# Patient Record
Sex: Female | Born: 1995
Health system: Southern US, Community
[De-identification: ages and names within clinical notes are randomized; demographics above are authoritative.]

## PROBLEM LIST (undated history)

## (undated) DIAGNOSIS — N926 Irregular menstruation, unspecified: Principal | ICD-10-CM

## (undated) DIAGNOSIS — J302 Other seasonal allergic rhinitis: Secondary | ICD-10-CM

## (undated) DIAGNOSIS — E281 Androgen excess: Secondary | ICD-10-CM

## (undated) DIAGNOSIS — Z7689 Persons encountering health services in other specified circumstances: Principal | ICD-10-CM

## (undated) DIAGNOSIS — Z309 Encounter for contraceptive management, unspecified: Secondary | ICD-10-CM

## (undated) HISTORY — DX: Persons encountering health services in other specified circumstances: Z76.89

## (undated) HISTORY — DX: Irregular menstruation, unspecified: N92.6

## (undated) HISTORY — PX: GASTROSCHISIS CLOSURE: SHX1700

## (undated) HISTORY — DX: Other seasonal allergic rhinitis: J30.2

## (undated) HISTORY — DX: Androgen excess: E28.1

## (undated) HISTORY — DX: Encounter for contraceptive management, unspecified: Z30.9

---
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
## 2014-06-27 ENCOUNTER — Encounter: Payer: Self-pay | Admitting: Adult Health

## 2014-06-27 ENCOUNTER — Ambulatory Visit (INDEPENDENT_AMBULATORY_CARE_PROVIDER_SITE_OTHER): Payer: BC Managed Care – PPO | Admitting: Adult Health

## 2014-06-27 VITALS — BP 122/70 | HR 84 | Ht 61.0 in | Wt 197.0 lb

## 2014-06-27 DIAGNOSIS — Z3041 Encounter for surveillance of contraceptive pills: Secondary | ICD-10-CM | POA: Diagnosis not present

## 2014-06-27 DIAGNOSIS — Z309 Encounter for contraceptive management, unspecified: Secondary | ICD-10-CM | POA: Insufficient documentation

## 2014-06-27 HISTORY — DX: Encounter for contraceptive management, unspecified: Z30.9

## 2014-06-27 MED ORDER — NORGESTIM-ETH ESTRAD TRIPHASIC 0.18/0.215/0.25 MG-35 MCG PO TABS: 1.0000 | ORAL_TABLET | Freq: Every day | ORAL | Status: DC

## 2014-06-27 NOTE — Progress Notes (Signed)
Subjective:     Patient ID: Morgan Franklin Franklin, female   DOB: 1995/09/14, 19 y.o.   MRN: [id number]  HPI Morgan Franklin is a [age] year old white female in to get refill on OCs.  Review of Systems Patient denies any headaches, hearing loss, fatigue, blurred vision, shortness of breath, chest pain, abdominal pain, problems with bowel movements, urination, or intercourse. No joint pain or mood swings.Periods are regular on the pill.  Reviewed past medical,surgical, social and family history. Reviewed medications and allergies.     Objective:   Physical Exam BP 122/70 mmHg  Pulse 84  Ht 5\' 1"  (1.549 m)  Wt 197 lb (89.359 kg)  BMI 37.24 kg/m2  LMP 06/24/2014 Skin warm and dry. Neck: mid line trachea, normal thyroid, good ROM, no lymphadenopathy noted. Lungs: clear to ausculation bilaterally. Cardiovascular: regular rate and rhythm.She is happy with the pill and wants to continue.    Assessment:     Contraceptive management    Plan:    Use condoms Refilled tri sprintec x 1 year. Take 1 daily disp 1 pack Return in 1 year for pap and physical

## 2014-06-27 NOTE — Patient Instructions (Signed)
Pap at 20 Continue OCs Use condoms

## 2015-05-25 ENCOUNTER — Encounter: Payer: Self-pay | Admitting: Adult Health

## 2015-05-25 ENCOUNTER — Ambulatory Visit (INDEPENDENT_AMBULATORY_CARE_PROVIDER_SITE_OTHER): Payer: BC Managed Care – PPO | Admitting: Adult Health

## 2015-05-25 VITALS — BP 104/70 | HR 80 | Ht 61.0 in | Wt 211.0 lb

## 2015-05-25 DIAGNOSIS — Z3041 Encounter for surveillance of contraceptive pills: Secondary | ICD-10-CM | POA: Diagnosis not present

## 2015-05-25 MED ORDER — NORGESTIM-ETH ESTRAD TRIPHASIC 0.18/0.215/0.25 MG-35 MCG PO TABS: 1.0000 | ORAL_TABLET | Freq: Every day | ORAL | Status: DC

## 2015-05-25 NOTE — Progress Notes (Signed)
Subjective:     Patient ID: Morgan Franklin, female   DOB: 09/01/1995, 20 y.o.   MRN: [id number]  HPI Morgan Franklin is a 20 year old white female in to get refill on OCs, she is doing well and just had 20st birthday yesterday.   Review of Systems Patient denies any headaches, hearing loss, fatigue, blurred vision, shortness of breath, chest pain, abdominal pain, problems with bowel movements, urination, or intercourse. No joint pain or mood swings. Reviewed past medical,surgical, social and family history. Reviewed medications and allergies.     Objective:   Physical Exam BP 104/70 mmHg  Pulse 80  Ht  (1.549 m)  Wt 211 lb (95.709 kg)  BMI 39.89 kg/m2  LMP 04/24/2015 (Approximate) Skin warm and dry. Neck: mid line trachea, normal thyroid, good ROM, no lymphadenopathy noted. Lungs: clear to ausculation bilaterally. Cardiovascular: regular rate and rhythm.She is happy with her pills and wants to continue.    Assessment:     Contraceptive management    Plan:     Refilled norgestimate-ethinyl estradiol triphasic 0.18/0.215/0.25 mg-35 mCG tablets,  Disp 1 pack take 1 daily with 11 refills   Return in June for pap and physical

## 2015-05-25 NOTE — Patient Instructions (Signed)
Continue OC s Return in June for pap and physical

## 2015-09-19 ENCOUNTER — Encounter (HOSPITAL_COMMUNITY): Payer: Self-pay | Admitting: Emergency Medicine

## 2015-09-19 ENCOUNTER — Emergency Department (HOSPITAL_COMMUNITY)
Admission: EM | Admit: 2015-09-19 | Discharge: 2015-09-19 | Disposition: A | Discharge status: Home / Self Care | Payer: Worker's Compensation | Attending: Emergency Medicine | Admitting: Emergency Medicine

## 2015-09-19 DIAGNOSIS — Y929 Unspecified place or not applicable: Secondary | ICD-10-CM | POA: Insufficient documentation

## 2015-09-19 DIAGNOSIS — W208XXA Other cause of strike by thrown, projected or falling object, initial encounter: Secondary | ICD-10-CM | POA: Insufficient documentation

## 2015-09-19 DIAGNOSIS — S0990XA Unspecified injury of head, initial encounter: Secondary | ICD-10-CM

## 2015-09-19 DIAGNOSIS — Y999 Unspecified external cause status: Secondary | ICD-10-CM | POA: Insufficient documentation

## 2015-09-19 DIAGNOSIS — S098XXA Other specified injuries of head, initial encounter: Secondary | ICD-10-CM | POA: Diagnosis not present

## 2015-09-19 DIAGNOSIS — Y939 Activity, unspecified: Secondary | ICD-10-CM | POA: Diagnosis not present

## 2015-09-19 MED ORDER — IBUPROFEN 800 MG PO TABS
800.0000 mg | ORAL_TABLET | Freq: Once | ORAL | Status: AC
  Administered 2015-09-19: 800 mg via ORAL
  Filled 2015-09-19: qty 1

## 2015-09-19 NOTE — Discharge Instructions (Signed)
Head Injury, Adult °You have a head injury. Headaches and throwing up (vomiting) are common after a head injury. It should be easy to wake up from sleeping. Sometimes you must stay in the hospital. Most problems happen within the first 24 hours. Side effects may occur up to 7-10 days after the injury.  °WHAT ARE THE TYPES OF HEAD INJURIES? °Head injuries can be as minor as a bump. Some head injuries can be more severe. More severe head injuries include: °· A jarring injury to the brain (concussion). °· A bruise of the brain (contusion). This mean there is bleeding in the brain that can cause swelling. °· A cracked skull (skull fracture). °· Bleeding in the brain that collects, clots, and forms a bump (hematoma). °WHEN SHOULD I GET HELP RIGHT AWAY?  °· You are confused or sleepy. °· You cannot be woken up. °· You feel sick to your stomach (nauseous) or keep throwing up (vomiting). °· Your dizziness or unsteadiness is getting worse. °· You have very bad, lasting headaches that are not helped by medicine. Take medicines only as told by your doctor. °· You cannot use your arms or legs like normal. °· You cannot walk. °· You notice changes in the black spots in the center of the colored part of your eye (pupil). °· You have clear or bloody fluid coming from your nose or ears. °· You have trouble seeing. °During the next 24 hours after the injury, you must stay with someone who can watch you. This person should get help right away (call 911 in the [place] if you start to shake and are not able to control it (have seizures), you pass out, or you are unable to wake up. °HOW CAN I PREVENT A HEAD INJURY IN THE FUTURE? °· Wear seat belts. °· Wear a helmet while bike riding and playing sports like football. °· Stay away from dangerous activities around the house. °WHEN CAN I RETURN TO NORMAL ACTIVITIES AND ATHLETICS? °See your doctor before doing these activities. You should not do normal activities or play contact sports until 1  week after the following symptoms have stopped: °· Headache that does not go away. °· Dizziness. °· Poor attention. °· Confusion. °· Memory problems. °· Sickness to your stomach or throwing up. °· Tiredness. °· Fussiness. °· Bothered by bright lights or loud noises. °· Anxiousness or depression. °· Restless sleep. °MAKE SURE YOU:  °· Understand these instructions. °· Will watch your condition. °· Will get help right away if you are not doing well or get worse. °  °This information is not intended to replace advice given to you by your health care provider. Make sure you discuss any questions you have with your health care provider. °  °Document Released: 03/06/2008 Document Revised: 04/14/2014 Document Reviewed: 11/29/2012 °Elsevier Interactive Patient Education ©2016 [employer] ° °

## 2015-09-19 NOTE — ED Notes (Signed)
Pt c/o head pain after a quart container of paint struck her head. Pt denies any loc.
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
## 2016-01-01 DIAGNOSIS — M79673 Pain in unspecified foot: Secondary | ICD-10-CM | POA: Diagnosis not present

## 2016-01-25 DIAGNOSIS — M79673 Pain in unspecified foot: Secondary | ICD-10-CM | POA: Diagnosis not present

## 2016-01-25 DIAGNOSIS — Z6839 Body mass index (BMI) 39.0-39.9, adult: Secondary | ICD-10-CM | POA: Diagnosis not present

## 2016-02-19 DIAGNOSIS — M7730 Calcaneal spur, unspecified foot: Secondary | ICD-10-CM | POA: Diagnosis not present

## 2016-03-18 DIAGNOSIS — M79671 Pain in right foot: Secondary | ICD-10-CM | POA: Diagnosis not present

## 2016-03-18 DIAGNOSIS — M722 Plantar fascial fibromatosis: Secondary | ICD-10-CM | POA: Diagnosis not present

## 2016-05-07 ENCOUNTER — Telehealth: Payer: Self-pay | Admitting: Adult Health

## 2016-05-07 DIAGNOSIS — M25531 Pain in right wrist: Secondary | ICD-10-CM | POA: Diagnosis not present

## 2016-05-07 MED ORDER — NORGESTIM-ETH ESTRAD TRIPHASIC 0.18/0.215/0.25 MG-35 MCG PO TABS: 1.0000 | ORAL_TABLET | Freq: Every day | ORAL | 3 refills | Status: DC

## 2016-05-07 NOTE — Telephone Encounter (Signed)
Pt called stating that she would like a refill of her Birth control medication. Pt states that she uses [facility] in [place]. Please contact pt

## 2016-05-07 NOTE — Telephone Encounter (Signed)
Will refill OCs x 3 

## 2016-05-07 NOTE — Telephone Encounter (Signed)
Spoke with pt. Pt is requesting a refill on birth control. I advised pt needs to schedule an appt for pap and physical. Pt will check schedule and call back for appt. Thanks!! JSY

## 2016-07-10 ENCOUNTER — Encounter: Payer: Self-pay | Admitting: Adult Health

## 2016-07-10 ENCOUNTER — Ambulatory Visit (INDEPENDENT_AMBULATORY_CARE_PROVIDER_SITE_OTHER): Payer: BLUE CROSS/BLUE SHIELD | Admitting: Adult Health

## 2016-07-10 VITALS — BP 102/60 | HR 77 | Ht 60.0 in | Wt 216.5 lb

## 2016-07-10 DIAGNOSIS — R109 Unspecified abdominal pain: Secondary | ICD-10-CM | POA: Diagnosis not present

## 2016-07-10 DIAGNOSIS — Z3041 Encounter for surveillance of contraceptive pills: Secondary | ICD-10-CM | POA: Diagnosis not present

## 2016-07-10 MED ORDER — NORGESTIM-ETH ESTRAD TRIPHASIC 0.18/0.215/0.25 MG-35 MCG PO TABS: 1.0000 | ORAL_TABLET | Freq: Every day | ORAL | 4 refills | Status: DC

## 2016-07-10 MED ORDER — NORETHIN ACE-ETH ESTRAD-FE 1-20 MG-MCG(24) PO CAPS: 1.0000 | ORAL_CAPSULE | Freq: Every day | ORAL | 0 refills | Status: DC

## 2016-07-10 NOTE — Progress Notes (Signed)
Subjective:     Patient ID: Morgan Franklin, female   DOB: September 07, 1995, 21 y.o.   MRN: [id number]  HPI Morgan Franklin is a 21 year old white female in for refill on OCs, has more cramping recently.   Review of Systems Patient denies any headaches, hearing loss, fatigue, blurred vision, shortness of breath, chest pain,  problems with bowel movements, urination, or intercourse. No joint pain or mood swings.+cramps  Reviewed past medical,surgical, social and family history. Reviewed medications and allergies.     Objective:   Physical Exam BP 102/60 (BP Location: Left Arm, Patient Position: Sitting, Cuff Size: Large)   Pulse 77   Ht 5' (1.524 m)   Wt 216 lb 8 oz (98.2 kg)   LMP 06/22/2016 (Approximate)   BMI 42.28 kg/m  Skin warm and dry. Lungs: clear to ausculation bilaterally. Cardiovascular: regular rate and rhythm.   Will change OCs and will get back in for pap and physical.  Assessment:     1. Encounter for surveillance of contraceptive pills   2. Abdominal cramping       Plan:     Meds ordered this encounter  Medications  . DISCONTD: Norgestimate-Ethinyl Estradiol Triphasic 0.18/0.215/0.25 MG-35 MCG tablet    Sig: Take 1 tablet by mouth daily.    Dispense:  3 Package    Refill:  4    Order Specific Question:   Supervising Provider    Answer:   Despina Hidden, LUTHER H [[id number]]  . Norethin Ace-Eth Estrad-FE (TAYTULLA) 1-20 MG-MCG(24) CAPS    Sig: Take 1 tablet by mouth daily.    Dispense:  112 capsule    Refill:  0    Order Specific Question:   Supervising Provider    Answer:   Duane Lope H [[id number]]  Follow up in 3 months for pap and physical

## 2016-10-22 ENCOUNTER — Other Ambulatory Visit (HOSPITAL_COMMUNITY)
Admission: RE | Admit: 2016-10-22 | Discharge: 2016-10-22 | Disposition: A | Discharge status: Home / Self Care | Payer: BLUE CROSS/BLUE SHIELD | Source: Ambulatory Visit | Attending: Adult Health | Admitting: Adult Health

## 2016-10-22 ENCOUNTER — Encounter: Payer: Self-pay | Admitting: Adult Health

## 2016-10-22 ENCOUNTER — Ambulatory Visit (INDEPENDENT_AMBULATORY_CARE_PROVIDER_SITE_OTHER): Payer: BLUE CROSS/BLUE SHIELD | Admitting: Adult Health

## 2016-10-22 VITALS — BP 110/80 | HR 76 | Ht 61.0 in | Wt 198.0 lb

## 2016-10-22 DIAGNOSIS — Z3041 Encounter for surveillance of contraceptive pills: Secondary | ICD-10-CM | POA: Diagnosis not present

## 2016-10-22 DIAGNOSIS — Z01419 Encounter for gynecological examination (general) (routine) without abnormal findings: Secondary | ICD-10-CM | POA: Diagnosis not present

## 2016-10-22 MED ORDER — NORETHIN ACE-ETH ESTRAD-FE 1-20 MG-MCG(24) PO CAPS: 1.0000 | ORAL_CAPSULE | Freq: Every day | ORAL | 4 refills | Status: DC

## 2016-10-22 NOTE — Progress Notes (Signed)
Patient ID: Morgan GoodellKatie Franklin, female   DOB: [birth year], 21 y.o.   MRN: [id number] History of Present Illness: Morgan Franklin is a 21 year old white female in for a well woman gyn exam and first pap.Has been on Taytulla for almost 3 months and cramps better.  [facility]P is [facility].    Current Medications, Allergies, Past Medical History, Past Surgical History, Family History and Social History were reviewed in [facility] electronic medical record.     Review of Systems: Patient denies any headaches, hearing loss, fatigue, blurred vision, shortness of breath, chest pain, abdominal pain, problems with bowel movements, urination, or intercourse. No joint pain or mood swings.Cramps better on OCs    Physical Exam:BP 110/80 (BP Location: Left Arm, Patient Position: Sitting, Cuff Size: Small)   Pulse 76   Ht 5\' 1"  (1.549 m)   Wt 198 lb (89.8 kg)   LMP 10/12/2016   BMI 37.41 kg/m  General:  Well developed, well nourished, no acute distress Skin:  Warm and dry Neck:  Midline trachea, normal thyroid, good ROM, no lymphadenopathy Lungs; Clear to auscultation bilaterally Breast:  No dominant palpable mass, retraction, or nipple discharge Cardiovascular: Regular rate and rhythm Abdomen:  Soft, non tender, no hepatosplenomegaly,increased hair growth Pelvic:  External genitalia is normal in appearance, no lesions.  The vagina is normal in appearance. Urethra has no lesions or masses. The cervix is smooth, pap with GC/CHL and reflex HPV performed.  Uterus is felt to be normal size, shape, and contour.  No adnexal masses or tenderness noted.Bladder is non tender, no masses felt. Extremities/musculoskeletal:  No swelling or varicosities noted, no clubbing or cyanosis Psych:  No mood changes, alert and cooperative,seems happy PHQ 2 score 0.   Impression: 1. Encounter for gynecological examination with Papanicolaou smear of cervix   2. Encounter for surveillance of contraceptive pills       Plan: Meds ordered  this encounter  Medications  . Norethin Ace-Eth Estrad-FE (TAYTULLA) 1-20 MG-MCG(24) CAPS    Sig: Take 1 capsule by mouth daily.    Dispense:  84 capsule    Refill:  4    Order Specific Question:   Supervising Provider    Answer:   Lazaro ArmsEURE, Morgan H [[id number]]  Physical in 1 year Pap in 3 if normal Pap with GC/CHLand reflex HPV sent Labs with PCP

## 2016-10-22 NOTE — Addendum Note (Signed)
Addended by: Federico FlakeNES, Cleburn Maiolo A on: 10/22/2016 10:05 AM   Modules accepted: Orders

## 2016-10-23 LAB — CYTOLOGY - PAP
CHLAMYDIA, DNA PROBE: NEGATIVE
DIAGNOSIS: NEGATIVE
HPV (WINDOPATH): NOT DETECTED
NEISSERIA GONORRHEA: NEGATIVE

## 2017-04-10 DIAGNOSIS — S21109A Unspecified open wound of unspecified front wall of thorax without penetration into thoracic cavity, initial encounter: Secondary | ICD-10-CM | POA: Diagnosis not present

## 2017-04-28 ENCOUNTER — Ambulatory Visit: Payer: BLUE CROSS/BLUE SHIELD | Admitting: General Surgery

## 2017-04-28 ENCOUNTER — Encounter: Payer: Self-pay | Admitting: General Surgery

## 2017-04-28 VITALS — BP 157/86 | HR 84 | Temp 99.8°F | Ht 60.0 in | Wt 213.0 lb

## 2017-04-28 DIAGNOSIS — T8189XA Other complications of procedures, not elsewhere classified, initial encounter: Secondary | ICD-10-CM

## 2017-04-28 NOTE — Progress Notes (Signed)
Morgan Franklin; [id number]; 11-21-1995   HPI Patient is a 22 year old white female who was referred to my care by Dr. Margo Aye for evaluation and treatment of a wound on her upper abdominal wall.  She underwent malrotation surgery as an infant.  Intermittently over the past few years, she has had clear yellow to bloody drainage from a wound along the midline of the upper transverse surgical scar.  She never has fevers or purulent drainage.  She currently has no pain in this region.  It can be tender to touch when appears to be irritated. Past Medical History:  Diagnosis Date  . Androgen excess 10/14/2012   Has dark hair on arms, increase hair on back and belly and chin  . Contraceptive management 06/27/2014  . Irregular periods 10/14/2012   Will try OCs  . Menstrual extraction 01/14/2013   Periods more regular on the pill  . Seasonal allergies     Past Surgical History:  Procedure Laterality Date  . GASTROSCHISIS CLOSURE     43 days old    Family History  Problem Relation Age of Onset  . Hypertension Mother   . Stroke Mother   . Diabetes Father   . Diabetes Paternal Grandmother   . Diabetes Maternal Grandfather     Current Outpatient Medications on File Prior to Visit  Medication Sig Dispense Refill  . Norethin Ace-Eth Estrad-FE (TAYTULLA) 1-20 MG-MCG(24) CAPS Take 1 capsule by mouth daily. 84 capsule 4  . PRESCRIPTION MEDICATION Cream for skin-prn.     No current facility-administered medications on file prior to visit.     No Known Allergies  Social History   Substance and Sexual Activity  Alcohol Use Yes   Comment: very rarely    Social History   Tobacco Use  Smoking Status Never Smoker  Smokeless Tobacco Never Used    Review of Systems  Constitutional: Negative.   HENT: Negative.   Eyes: Negative.   Respiratory: Negative.   Cardiovascular: Negative.   Gastrointestinal: Negative.   Genitourinary: Negative.   Musculoskeletal: Negative.   Skin: Negative.    Neurological: Negative.   Endo/Heme/Allergies: Negative.   Psychiatric/Behavioral: Negative.     Objective   Vitals:   04/28/17 1501  BP: (!) 157/86  Pulse: 84  Temp: 99.8 F (37.7 C)    Physical Exam  Constitutional: She is oriented to person, place, and time and well-developed, well-nourished, and in no distress.  HENT:  Head: Normocephalic and atraumatic.  Cardiovascular: Normal rate, regular rhythm and normal heart sounds. Exam reveals no gallop and no friction rub.  No murmur heard. Pulmonary/Chest: Effort normal and breath sounds normal. No respiratory distress. She has no wheezes. She has no rales.  Abdominal: Soft. Bowel sounds are normal. She exhibits no distension. There is no tenderness. There is no rebound.  Punctate opening in the midportion of a transverse epigastric surgical scar.  No induration or erythema noted.  Silver nitrate was applied to the wound and the stick went in approximately 4-5 mm.  Neurological: She is alert and oriented to person, place, and time.  Skin: Skin is warm and dry. No erythema.  Vitals reviewed.  Dr. Scharlene Gloss notes reviewed. Assessment  Granuloma of surgical wound Plan   No need for acute surgical intervention at this time.  This is not a fistula to the intra-abdominal contents.  Mother states that patient had wound issues early in childhood.  Will try silver nitrate sticks.  If this does not work, will explore the operating room.  Follow-up in 2 weeks.

## 2017-04-30 ENCOUNTER — Ambulatory Visit: Payer: BLUE CROSS/BLUE SHIELD | Admitting: General Surgery

## 2017-05-07 ENCOUNTER — Telehealth: Payer: Self-pay | Admitting: *Deleted

## 2017-05-07 NOTE — Telephone Encounter (Signed)
Patient states she has not had a period since November, taken 5 pregnancy tests all negative. States she was placed on pills for period management.  Symptoms of period coming but hasn't come yet. Please advise.

## 2017-05-07 NOTE — Telephone Encounter (Signed)
Pt not having period on taytulla which is fine but cramps are back, if wants to change OCs make appt, or if Rip HarbourOk will address at physical in July

## 2017-05-12 ENCOUNTER — Ambulatory Visit: Payer: BLUE CROSS/BLUE SHIELD | Admitting: General Surgery

## 2017-05-12 ENCOUNTER — Encounter: Payer: Self-pay | Admitting: General Surgery

## 2017-05-12 VITALS — BP 157/94 | HR 93 | Temp 99.5°F | Ht 60.0 in | Wt 211.0 lb

## 2017-05-12 DIAGNOSIS — T8189XA Other complications of procedures, not elsewhere classified, initial encounter: Secondary | ICD-10-CM

## 2017-05-12 DIAGNOSIS — T8189XD Other complications of procedures, not elsewhere classified, subsequent encounter: Secondary | ICD-10-CM

## 2017-05-12 NOTE — Progress Notes (Signed)
Subjective:     Morgan GoodellKatie Franklin  Here for application of silver nitrate.  Still with minimal yellow drainage present.  No purulent drainage noted. Objective:    BP (!) 157/94   Pulse 93   Temp 99.5 F (37.5 C)   Ht 5' (1.524 m)   Wt 211 lb (95.7 kg)   BMI 41.21 kg/m   General:  alert, cooperative and no distress  Silver nitrate applied to punctate granuloma in the epigastric region of abdominal wall.     Assessment:    Granuloma of abdominal wall    Plan:   Follow-up in 2 weeks for wound check.

## 2017-05-26 ENCOUNTER — Ambulatory Visit: Payer: BLUE CROSS/BLUE SHIELD | Admitting: General Surgery

## 2017-05-26 ENCOUNTER — Encounter: Payer: Self-pay | Admitting: General Surgery

## 2017-05-26 VITALS — BP 144/85 | HR 86 | Temp 98.7°F | Ht 60.0 in | Wt 218.0 lb

## 2017-05-26 DIAGNOSIS — T8189XA Other complications of procedures, not elsewhere classified, initial encounter: Secondary | ICD-10-CM

## 2017-05-26 DIAGNOSIS — T8189XS Other complications of procedures, not elsewhere classified, sequela: Secondary | ICD-10-CM

## 2017-05-26 NOTE — Progress Notes (Signed)
Subjective:     Morgan GoodellKatie Franklin  Here for follow-up of granuloma.  States the drainage may be less. Objective:    BP (!) 144/85   Pulse 86   Temp 98.7 F (37.1 C)   Ht 5' (1.524 m)   Wt 218 lb (98.9 kg)   BMI 42.58 kg/m   General:  alert, cooperative and no distress  Abdomen soft, punctate granuloma present in the epigastric region.  Silver nitrate applied.     Assessment:    Granuloma, abdominal wall, slowly resolving.    Plan:   Follow-up in 3 weeks for silver nitrate application.

## 2017-06-16 ENCOUNTER — Ambulatory Visit: Payer: BLUE CROSS/BLUE SHIELD | Admitting: General Surgery

## 2017-06-16 ENCOUNTER — Encounter: Payer: Self-pay | Admitting: General Surgery

## 2017-06-16 VITALS — BP 143/100 | HR 77 | Temp 99.1°F | Ht 60.0 in | Wt 215.0 lb

## 2017-06-16 DIAGNOSIS — T8189XD Other complications of procedures, not elsewhere classified, subsequent encounter: Secondary | ICD-10-CM

## 2017-06-16 NOTE — Patient Instructions (Signed)
Ganglion Cyst A ganglion cyst is a noncancerous, fluid-filled lump that occurs near joints or tendons. The ganglion cyst grows out of a joint or the lining of a tendon. It most often develops in the hand or wrist, but it can also develop in the shoulder, elbow, hip, knee, ankle, or foot. The round or oval ganglion cyst can be the size of a pea or larger than a grape. Increased activity may enlarge the size of the cyst because more fluid starts to build up. What are the causes? It is not known what causes a ganglion cyst to grow. However, it may be related to:  Inflammation or irritation around the joint.  An injury.  Repetitive movements or overuse.  Arthritis.  What increases the risk? Risk factors include:  Being a woman.  Being age 20-50.  What are the signs or symptoms? Symptoms may include:  A lump. This most often appears on the hand or wrist, but it can occur in other areas of the body.  Tingling.  Pain.  Numbness.  Muscle weakness.  Weak grip.  Less movement in a joint.  How is this diagnosed? Ganglion cysts are most often diagnosed based on a physical exam. Your health care provider will feel the lump and may shine a light alongside it. If it is a ganglion cyst, a light often shines through it. Your health care provider may order an X-ray, ultrasound, or MRI to rule out other conditions. How is this treated? Ganglion cysts usually go away on their own without treatment. If pain or other symptoms are involved, treatment may be needed. Treatment is also needed if the ganglion cyst limits your movement or if it gets infected. Treatment may include:  Wearing a brace or splint on your wrist or finger.  Taking anti-inflammatory medicine.  Draining fluid from the lump with a needle (aspiration).  Injecting a steroid into the joint.  Surgery to remove the ganglion cyst.  Follow these instructions at home:  Do not press on the ganglion cyst, poke it with a  needle, or hit it.  Take medicines only as directed by your health care provider.  Wear your brace or splint as directed by your health care provider.  Watch your ganglion cyst for any changes.  Keep all follow-up visits as directed by your health care provider. This is important. Contact a health care provider if:  Your ganglion cyst becomes larger or more painful.  You have increased redness, red streaks, or swelling.  You have pus coming from the lump.  You have weakness or numbness in the affected area.  You have a fever or chills. This information is not intended to replace advice given to you by your health care provider. Make sure you discuss any questions you have with your health care provider. Document Released: 03/21/2000 Document Revised: 08/30/2015 Document Reviewed: 09/06/2013 Elsevier Interactive Patient Education  2018 Elsevier Inc.  

## 2017-06-16 NOTE — Progress Notes (Signed)
Subjective:     Morgan GoodellKatie Franklin  Here for follow-up of granuloma of abdominal wall.  She states she has not had any significant drainage since I last saw her.  She also complains of a lump on the back of her right wrist.  Is been present for almost 8 years.  Recently has been causing her some discomfort. Objective:    BP (!) 143/100   Pulse 77   Temp 99.1 F (37.3 C)   Ht 5' (1.524 m)   Wt 215 lb (97.5 kg)   BMI 41.99 kg/m   General:  alert, cooperative and no distress  Abdominal wall with healed granuloma.  No drainage present. Right wrist with small dorsal ganglion cyst present.     Assessment:  Granuloma of abdominal wall skin, resolved Ganglion cyst, right wrist  Stable    Plan:  I told the patient to return to my office should her granuloma start draining her and causing her problems.  I told her she needs to see an orthopedic surgeon concerning her ganglion cyst on the right wrist.  Literature was given.  Follow-up here as needed.

## 2017-10-19 DIAGNOSIS — M7730 Calcaneal spur, unspecified foot: Secondary | ICD-10-CM | POA: Diagnosis not present

## 2017-10-19 DIAGNOSIS — M79671 Pain in right foot: Secondary | ICD-10-CM | POA: Diagnosis not present

## 2017-10-19 DIAGNOSIS — M722 Plantar fascial fibromatosis: Secondary | ICD-10-CM | POA: Diagnosis not present

## 2017-11-16 DIAGNOSIS — L304 Erythema intertrigo: Secondary | ICD-10-CM | POA: Diagnosis not present

## 2017-11-16 DIAGNOSIS — M79671 Pain in right foot: Secondary | ICD-10-CM | POA: Diagnosis not present

## 2017-11-16 DIAGNOSIS — M7730 Calcaneal spur, unspecified foot: Secondary | ICD-10-CM | POA: Diagnosis not present

## 2017-11-16 DIAGNOSIS — Z6839 Body mass index (BMI) 39.0-39.9, adult: Secondary | ICD-10-CM | POA: Diagnosis not present

## 2017-11-16 DIAGNOSIS — M722 Plantar fascial fibromatosis: Secondary | ICD-10-CM | POA: Diagnosis not present

## 2017-11-17 ENCOUNTER — Other Ambulatory Visit: Payer: Self-pay | Admitting: Adult Health

## 2017-12-21 DIAGNOSIS — M722 Plantar fascial fibromatosis: Secondary | ICD-10-CM | POA: Diagnosis not present

## 2017-12-21 DIAGNOSIS — M79671 Pain in right foot: Secondary | ICD-10-CM | POA: Diagnosis not present

## 2018-02-01 DIAGNOSIS — Z6838 Body mass index (BMI) 38.0-38.9, adult: Secondary | ICD-10-CM | POA: Diagnosis not present

## 2018-02-01 DIAGNOSIS — J019 Acute sinusitis, unspecified: Secondary | ICD-10-CM | POA: Diagnosis not present

## 2018-02-01 DIAGNOSIS — H6121 Impacted cerumen, right ear: Secondary | ICD-10-CM | POA: Diagnosis not present

## 2018-05-19 ENCOUNTER — Telehealth: Payer: Self-pay | Admitting: *Deleted

## 2018-05-19 MED ORDER — NORETHINDRONE ACET-ETHINYL EST 1-20 MG-MCG PO TABS: 1.0000 | ORAL_TABLET | Freq: Every day | ORAL | 11 refills | Status: DC

## 2018-05-19 NOTE — Telephone Encounter (Signed)
Patient informed Evette Doffingaytulla not covered by insurance.  Wants to try generic.

## 2018-05-19 NOTE — Addendum Note (Signed)
Addended by: Cyril MourningGRIFFIN, JENNIFER A on: 05/19/2018 04:42 PM   Modules accepted: Orders

## 2018-05-19 NOTE — Telephone Encounter (Signed)
Pt aware that Junel rx sent, use condoms for 1 pack

## 2018-05-19 NOTE — Telephone Encounter (Signed)
Taytulla denied by insurance.

## 2018-07-23 ENCOUNTER — Other Ambulatory Visit: Payer: Self-pay | Admitting: Adult Health

## 2018-11-09 DIAGNOSIS — Z6838 Body mass index (BMI) 38.0-38.9, adult: Secondary | ICD-10-CM | POA: Diagnosis not present

## 2018-11-09 DIAGNOSIS — E6609 Other obesity due to excess calories: Secondary | ICD-10-CM | POA: Diagnosis not present

## 2018-11-09 DIAGNOSIS — Z0001 Encounter for general adult medical examination with abnormal findings: Secondary | ICD-10-CM | POA: Diagnosis not present

## 2018-11-15 DIAGNOSIS — L304 Erythema intertrigo: Secondary | ICD-10-CM | POA: Diagnosis not present

## 2018-11-15 DIAGNOSIS — M25512 Pain in left shoulder: Secondary | ICD-10-CM | POA: Diagnosis not present

## 2018-11-15 DIAGNOSIS — Z6839 Body mass index (BMI) 39.0-39.9, adult: Secondary | ICD-10-CM | POA: Diagnosis not present

## 2018-11-15 DIAGNOSIS — E669 Obesity, unspecified: Secondary | ICD-10-CM | POA: Diagnosis not present

## 2018-11-15 DIAGNOSIS — S21109A Unspecified open wound of unspecified front wall of thorax without penetration into thoracic cavity, initial encounter: Secondary | ICD-10-CM | POA: Diagnosis not present

## 2019-03-02 DIAGNOSIS — J029 Acute pharyngitis, unspecified: Secondary | ICD-10-CM | POA: Diagnosis not present

## 2019-05-14 ENCOUNTER — Other Ambulatory Visit: Payer: Self-pay | Admitting: Adult Health

## 2019-06-14 ENCOUNTER — Ambulatory Visit: Payer: BC Managed Care – PPO | Admitting: General Surgery

## 2019-06-14 ENCOUNTER — Other Ambulatory Visit: Payer: Self-pay

## 2019-06-14 ENCOUNTER — Encounter: Payer: Self-pay | Admitting: General Surgery

## 2019-06-14 VITALS — BP 129/87 | HR 85 | Temp 98.4°F | Resp 12 | Ht 60.0 in | Wt 220.0 lb

## 2019-06-14 DIAGNOSIS — L929 Granulomatous disorder of the skin and subcutaneous tissue, unspecified: Secondary | ICD-10-CM

## 2019-06-14 NOTE — Progress Notes (Signed)
Subjective:     Morgan Franklin  Patient presents back to my care for evaluation treatment of a granuloma lateral to her previously treated granuloma along her upper transverse incision.  She states is been present for some time.  It can be tender to touch.  She has tried to keep it clean with soap and water.  She denies any fevers.  She has 0 out of 10 pain. Objective:    BP 129/87   Pulse 85   Temp 98.4 F (36.9 C) (Oral)   Resp 12   Ht 5' (1.524 m)   Wt 220 lb (99.8 kg)   LMP 05/20/2019   BMI 42.97 kg/m   General:  alert, cooperative and no distress  Head is normocephalic, atraumatic Lungs are clear to auscultation with good breath sounds bilaterally Heart examination reveals a regular rate and rhythm without S3, S4, murmurs. Abdomen is soft.  The patient has a large upper abdominal transverse scar.  The previously treated area has healed.  She has a punctate wound along the scar in the left subcostal region.  No surrounding erythema is noted.  No purulent drainage is noted.  Silver nitrate was applied to the punctate wound.  It only went in approximately 5 to 7 mm.     Assessment:    Granuloma of surgical wound    Plan:   Patient to keep wound clean and dry with soap and water.  We will follow-up in the office in 3 weeks if it has not healed.

## 2019-07-05 ENCOUNTER — Encounter: Payer: Self-pay | Admitting: General Surgery

## 2019-07-05 ENCOUNTER — Other Ambulatory Visit: Payer: Self-pay

## 2019-07-05 ENCOUNTER — Ambulatory Visit: Payer: BC Managed Care – PPO | Admitting: General Surgery

## 2019-07-05 VITALS — BP 123/84 | HR 81 | Temp 98.7°F | Resp 14 | Ht 60.0 in | Wt 221.0 lb

## 2019-07-05 DIAGNOSIS — L929 Granulomatous disorder of the skin and subcutaneous tissue, unspecified: Secondary | ICD-10-CM

## 2019-07-05 MED ORDER — NYSTATIN 100000 UNIT/GM EX POWD: 1.0000 "application " | Freq: Three times a day (TID) | CUTANEOUS | 1 refills | Status: DC

## 2019-07-05 MED ORDER — TERBINAFINE HCL 1 % EX CREA: 1.0000 "application " | TOPICAL_CREAM | Freq: Two times a day (BID) | CUTANEOUS | 2 refills | Status: DC

## 2019-07-05 NOTE — Progress Notes (Signed)
Subjective:     Lorrin Goodell  Here for follow-up wound check.  Still having yellow drainage from small punctate granuloma along the left lateral aspect of an upper midline incision. Objective:    BP 123/84   Pulse 81   Temp 98.7 F (37.1 C) (Oral)   Resp 14   Ht 5' (1.524 m)   Wt 221 lb (100.2 kg)   LMP 06/21/2019   SpO2 98%   BMI 43.16 kg/m   General:  alert, cooperative and no distress  Abdomen soft.  Punctate wound present along the left lateral aspect of the upper midline incision.  Surrounding fungal appearing rash is present along the skin fold of the upper abdominal incision.  Silver nitrate applied.  Only went to a depth of 3 mm.     Assessment:    Granuloma of skin, previous surgical scar, resolving slowly.  Cutaneous fungal rash of skin fold     Plan:   Lamisil antifungal cream prescribed.  Patient has tried nystatin powder in the past but it has not worked.  Told to keep the area clean and dry then apply the antifungal cream twice a day.  Follow-up here in 1 month.

## 2019-08-02 ENCOUNTER — Ambulatory Visit: Payer: BC Managed Care – PPO | Admitting: General Surgery

## 2019-08-22 DIAGNOSIS — L929 Granulomatous disorder of the skin and subcutaneous tissue, unspecified: Secondary | ICD-10-CM | POA: Diagnosis not present

## 2019-08-22 DIAGNOSIS — B49 Unspecified mycosis: Secondary | ICD-10-CM | POA: Diagnosis not present

## 2019-08-26 ENCOUNTER — Other Ambulatory Visit: Payer: Self-pay | Admitting: General Surgery

## 2019-08-26 DIAGNOSIS — L929 Granulomatous disorder of the skin and subcutaneous tissue, unspecified: Secondary | ICD-10-CM

## 2019-09-08 ENCOUNTER — Other Ambulatory Visit (HOSPITAL_COMMUNITY): Payer: Self-pay | Admitting: General Surgery

## 2019-09-08 ENCOUNTER — Other Ambulatory Visit: Payer: Self-pay | Admitting: General Surgery

## 2019-09-08 DIAGNOSIS — L929 Granulomatous disorder of the skin and subcutaneous tissue, unspecified: Secondary | ICD-10-CM

## 2019-09-13 ENCOUNTER — Ambulatory Visit (HOSPITAL_COMMUNITY)
Admission: RE | Admit: 2019-09-13 | Discharge: 2019-09-13 | Disposition: A | Discharge status: Home / Self Care | Payer: BC Managed Care – PPO | Source: Ambulatory Visit | Attending: General Surgery | Admitting: General Surgery

## 2019-09-13 ENCOUNTER — Other Ambulatory Visit: Payer: Self-pay

## 2019-09-13 DIAGNOSIS — S31109A Unspecified open wound of abdominal wall, unspecified quadrant without penetration into peritoneal cavity, initial encounter: Secondary | ICD-10-CM | POA: Diagnosis not present

## 2019-09-13 DIAGNOSIS — L929 Granulomatous disorder of the skin and subcutaneous tissue, unspecified: Secondary | ICD-10-CM | POA: Diagnosis not present

## 2019-09-23 ENCOUNTER — Telehealth: Payer: Self-pay | Admitting: Adult Health

## 2019-09-23 NOTE — Telephone Encounter (Signed)
Pt states that menstrual is very heavy and has her feeling very weak and nauseous and threw up at work

## 2019-09-23 NOTE — Telephone Encounter (Signed)
Telephoned patient at home number. Patient states had cycle 2 weeks ago and started another cycle Sunday. Cycle is now heavier and passed dime size blood clots a few times and changing pads every 3 hours.  Patient states did miss a BCP. Advised patient to make sure takes BCP around the same time every day and not to miss a dose. If symptoms get worse would need to be evaluated. Scheduled patient for appointment. Patient voiced understanding.

## 2019-09-26 ENCOUNTER — Encounter: Payer: Self-pay | Admitting: Adult Health

## 2019-09-26 ENCOUNTER — Ambulatory Visit: Payer: BC Managed Care – PPO | Admitting: Adult Health

## 2019-09-26 VITALS — BP 132/85 | HR 82 | Ht 61.0 in | Wt 222.0 lb

## 2019-09-26 DIAGNOSIS — N921 Excessive and frequent menstruation with irregular cycle: Secondary | ICD-10-CM | POA: Diagnosis not present

## 2019-09-26 DIAGNOSIS — N946 Dysmenorrhea, unspecified: Secondary | ICD-10-CM | POA: Diagnosis not present

## 2019-09-26 NOTE — Progress Notes (Signed)
  Subjective:     Patient ID: Morgan Franklin, female   DOB: 07-22-1995, 24 y.o.   MRN: [id number]  HPI Morgan Franklin is a 24 year old white female, single, G0P0, in complaining of bleeding on OCs, is in middle of pack and bleeding for 8 days now Did miss a pill a while back.  PCP is Dr Margo Aye   Review of Systems +tired Has bleeding in middle of pack for 8 days now Reviewed past medical,surgical, social and family history. Reviewed medications and allergies.     Objective:   Physical Exam BP 132/85 (BP Location: Left Arm, Patient Position: Sitting, Cuff Size: Large)   Pulse 82   Ht 5\' 1"  (1.549 m)   Wt 222 lb (100.7 kg)   LMP 09/18/2019 (Exact Date)   BMI 41.95 kg/m  Skin warm and dry. Neck: mid line trachea, normal thyroid, good ROM, no lymphadenopathy noted. Lungs: clear to ausculation bilaterally. Cardiovascular: regular rate and rhythm.    Assessment:     1. Irregular intermenstrual bleeding Check CBC,CMP and TSH Continue OCs Junel 1/20 for now but may change depending on labs   2. Menstrual cramps     Plan:     Return 7/21 for pap and physical

## 2019-09-27 DIAGNOSIS — N921 Excessive and frequent menstruation with irregular cycle: Secondary | ICD-10-CM | POA: Diagnosis not present

## 2019-09-28 LAB — CBC
Hematocrit: 42.2 % (ref 34.0–46.6)
Hemoglobin: 13.9 g/dL (ref 11.1–15.9)
MCH: 29.3 pg (ref 26.6–33.0)
MCHC: 32.9 g/dL (ref 31.5–35.7)
MCV: 89 fL (ref 79–97)
Platelets: 364 10*3/uL (ref 150–450)
RBC: 4.75 x10E6/uL (ref 3.77–5.28)
RDW: 12.4 % (ref 11.7–15.4)
WBC: 8.4 10*3/uL (ref 3.4–10.8)

## 2019-09-28 LAB — COMPREHENSIVE METABOLIC PANEL
ALT: 29 IU/L (ref 0–32)
AST: 24 IU/L (ref 0–40)
Albumin/Globulin Ratio: 1.3 (ref 1.2–2.2)
Albumin: 4 g/dL (ref 3.9–5.0)
Alkaline Phosphatase: 73 IU/L (ref 48–121)
BUN/Creatinine Ratio: 13 (ref 9–23)
BUN: 9 mg/dL (ref 6–20)
Bilirubin Total: <0.2 mg/dL (ref 0.0–1.2)
CO2: 20 mmol/L (ref 20–29)
Calcium: 9.2 mg/dL (ref 8.7–10.2)
Chloride: 99 mmol/L (ref 96–106)
Creatinine, Ser: 0.72 mg/dL (ref 0.57–1.00)
GFR calc Af Amer: 135 mL/min/{1.73_m2} (ref >59)
GFR calc non Af Amer: 117 mL/min/{1.73_m2} (ref >59)
Globulin, Total: 3.1 g/dL (ref 1.5–4.5)
Glucose: 170 mg/dL — ABNORMAL HIGH (ref 65–99)
Potassium: 4.5 mmol/L (ref 3.5–5.2)
Sodium: 137 mmol/L (ref 134–144)
Total Protein: 7.1 g/dL (ref 6.0–8.5)

## 2019-09-28 LAB — TSH: TSH: 2.17 u[IU]/mL (ref 0.450–4.500)

## 2019-09-30 ENCOUNTER — Telehealth: Payer: Self-pay | Admitting: Adult Health

## 2019-09-30 NOTE — Telephone Encounter (Signed)
Patient wants to be called about her lab results, and to see will her prescription be changed due to her labs.

## 2019-09-30 NOTE — Telephone Encounter (Signed)
Pt wants to know if Junel will be changed due to her labs?

## 2019-10-03 MED ORDER — DESOGESTREL-ETHINYL ESTRADIOL 0.15-30 MG-MCG PO TABS: 1.0000 | ORAL_TABLET | Freq: Every day | ORAL | 11 refills | Status: DC

## 2019-10-03 NOTE — Telephone Encounter (Signed)
Pt aware of labs, did eat, will change OCs to arpi

## 2019-10-03 NOTE — Addendum Note (Signed)
Addended by: Cyril Mourning A on: 10/03/2019 11:32 AM   Modules accepted: Orders

## 2019-10-13 ENCOUNTER — Ambulatory Visit (INDEPENDENT_AMBULATORY_CARE_PROVIDER_SITE_OTHER): Payer: BC Managed Care – PPO

## 2019-10-13 ENCOUNTER — Encounter: Payer: Self-pay | Admitting: Emergency Medicine

## 2019-10-13 ENCOUNTER — Ambulatory Visit
Admission: EM | Admit: 2019-10-13 | Discharge: 2019-10-13 | Disposition: A | Discharge status: Home / Self Care | Payer: BC Managed Care – PPO | Attending: Emergency Medicine | Admitting: Emergency Medicine

## 2019-10-13 DIAGNOSIS — M25472 Effusion, left ankle: Secondary | ICD-10-CM

## 2019-10-13 DIAGNOSIS — M25572 Pain in left ankle and joints of left foot: Secondary | ICD-10-CM

## 2019-10-13 DIAGNOSIS — M7732 Calcaneal spur, left foot: Secondary | ICD-10-CM | POA: Diagnosis not present

## 2019-10-13 MED ORDER — ACETAMINOPHEN 500 MG PO TABS
500.0000 mg | ORAL_TABLET | Freq: Four times a day (QID) | ORAL | 0 refills | Status: DC | PRN
Start: 2019-10-13 — End: 2021-02-01

## 2019-10-13 MED ORDER — PREDNISONE 10 MG (21) PO TBPK
ORAL_TABLET | ORAL | 0 refills | Status: DC
Start: 2019-10-13 — End: 2019-11-21

## 2019-10-13 NOTE — ED Provider Notes (Signed)
[facility]   [id number] 10/13/19 Arrival Time: 0846   Chief Complaint  Patient presents with  . Ankle Pain     SUBJECTIVE: History from: patient.  Morgan Franklin is a 24 y.o. female who presents to the urgent care for complaint of left ankle pain and swelling for the past 2 days.  Denies any precipitating event.  She localized pain to the left ankle.  She describes the pain as constant and achy.  She has tried OTC medications without relief.  Her symptoms are made worse with ROM.  She denies similar symptoms in the past.  Denies chills, fever, nausea, vomiting, diarrhea.   ROS: As per HPI.  All other pertinent ROS negative.     Past Medical History:  Diagnosis Date  . Androgen excess 10/14/2012   Has dark hair on arms, increase hair on back and belly and chin  . Contraceptive management 06/27/2014  . Irregular periods 10/14/2012   Will try OCs  . Menstrual extraction 01/14/2013   Periods more regular on the pill  . Seasonal allergies    Past Surgical History:  Procedure Laterality Date  . GASTROSCHISIS CLOSURE     44 days old   No Known Allergies No current facility-administered medications on file prior to encounter.   Current Outpatient Medications on File Prior to Encounter  Medication Sig Dispense Refill  . desogestrel-ethinyl estradiol (APRI) 0.15-30 MG-MCG tablet Take 1 tablet by mouth daily. 28 tablet 11  . nystatin (MYCOSTATIN/NYSTOP) powder Apply 1 application topically 3 (three) times daily. (Patient not taking: Reported on 09/26/2019) 45 g 1  . terbinafine (LAMISIL AT) 1 % cream Apply 1 application topically 2 (two) times daily. (Patient not taking: Reported on 09/26/2019) 30 g 2   Social History   Socioeconomic History  . Marital status: Single    Spouse name: Not on file  . Number of children: Not on file  . Years of education: Not on file  . Highest education level: Not on file  Occupational History  . Not on file  Tobacco Use  . Smoking  status: Never Smoker  . Smokeless tobacco: Never Used  Substance and Sexual Activity  . Alcohol use: Yes    Comment: very rarely  . Drug use: No  . Sexual activity: Yes    Birth control/protection: Pill  Other Topics Concern  . Not on file  Social History Narrative  . Not on file   Social Determinants of Health   Financial Resource Strain:   . Difficulty of Paying Living Expenses:   Food Insecurity:   . Worried About Programme researcher, broadcasting/film/video in the Last Year:   . Barista in the Last Year:   Transportation Needs:   . Freight forwarder (Medical):   Marland Kitchen Lack of Transportation (Non-Medical):   Physical Activity:   . Days of Exercise per Week:   . Minutes of Exercise per Session:   Stress:   . Feeling of Stress :   Social Connections:   . Frequency of Communication with Friends and Family:   . Frequency of Social Gatherings with Friends and Family:   . Attends Religious Services:   . Active Member of Clubs or Organizations:   . Attends Banker Meetings:   Marland Kitchen Marital Status:   Intimate Partner Violence:   . Fear of Current or Ex-Partner:   . Emotionally Abused:   Marland Kitchen Physically Abused:   . Sexually Abused:    Family History  Problem  Relation Age of Onset  . Hypertension Mother   . Stroke Mother   . Diabetes Father   . Diabetes Paternal Grandmother   . Diabetes Maternal Grandfather     OBJECTIVE:  Vitals:   10/13/19 0852 10/13/19 0901  BP: 130/71   Pulse: 95   Resp: 16   Temp: 99.4 F (37.4 C)   TempSrc: Oral   SpO2: 98%   Weight:  215 lb (97.5 kg)  Height:  5' (1.524 m)     Physical Exam Vitals reviewed.  Constitutional:      General: She is not in acute distress.    Appearance: Normal appearance. She is normal weight. She is not ill-appearing, toxic-appearing or diaphoretic.  Cardiovascular:     Rate and Rhythm: Normal rate and regular rhythm.     Pulses: Normal pulses.     Heart sounds: Normal heart sounds. No murmur heard.  No  friction rub. No gallop.   Pulmonary:     Effort: Pulmonary effort is normal. No respiratory distress.     Breath sounds: Normal breath sounds. No stridor. No wheezing, rhonchi or rales.  Chest:     Chest wall: No tenderness.  Musculoskeletal:        General: Swelling and tenderness present. No signs of injury.     Right ankle: Normal.     Left ankle: Swelling present. Tenderness present.     Comments: Patient is able to bear weight and ambulate with pain.  Left ankle is with obvious deformity when compared to the right ankle.  Normal flexion and extension, limited inversion and eversion.  No obvious surface trauma, ecchymosis, warmth, open wound.  Neurovascular status intact.  Neurological:     Mental Status: She is alert.      LABS:  No results found for this or any previous visit (from the past 24 hour(s)).   ASSESSMENT & PLAN:  1. Acute left ankle pain   2. Left ankle swelling     Meds ordered this encounter  Medications  . predniSONE (STERAPRED UNI-PAK 21 TAB) 10 MG (21) TBPK tablet    Sig: Take 6 tabs by mouth daily  for 1 days, then 5 tabs for 1 days, then 4 tabs for 1 days, then 3 tabs for 1 days, 2 tabs for 1 days, then 1 tab by mouth daily for 1 days    Dispense:  21 tablet    Refill:  0  . acetaminophen (TYLENOL) 500 MG tablet    Sig: Take 1 tablet (500 mg total) by mouth every 6 (six) hours as needed.    Dispense:  30 tablet    Refill:  0   Patient is stable at discharge.  The left ankle X-ray is negative for bony abnormality including fracture or dislocation.  I have reviewed the x-ray myself and the radiologist interpretation.  I am in agreement with the radiologist interpretation.   Discharge instructions Prednisone and Tylenol as prescribed Follow RICE instruction days attached Follow-up with orthopedic Return or go to ED for worsening of symptoms  Reviewed expectations re: course of current medical issues. Questions answered. Outlined signs and  symptoms indicating need for more acute intervention. Patient verbalized understanding. After Visit Summary given.      Note: This document was prepared using Dragon voice recognition software and may include unintentional dictation errors.    Durward Parcel, FNP 10/13/19 [phone]

## 2019-10-13 NOTE — Discharge Instructions (Addendum)
Prednisone and Tylenol as prescribed Follow RICE instruction days attached Follow-up with orthopedic Return or go to ED for worsening of symptoms

## 2019-10-13 NOTE — ED Triage Notes (Signed)
LT ankle pain and swelling since Tuesday.  Denies any injury.

## 2019-10-14 DIAGNOSIS — B49 Unspecified mycosis: Secondary | ICD-10-CM | POA: Diagnosis not present

## 2019-10-14 DIAGNOSIS — L929 Granulomatous disorder of the skin and subcutaneous tissue, unspecified: Secondary | ICD-10-CM | POA: Diagnosis not present

## 2019-10-26 ENCOUNTER — Encounter: Payer: Self-pay | Admitting: Adult Health

## 2019-10-26 ENCOUNTER — Ambulatory Visit (INDEPENDENT_AMBULATORY_CARE_PROVIDER_SITE_OTHER): Payer: BC Managed Care – PPO | Admitting: Adult Health

## 2019-10-26 ENCOUNTER — Other Ambulatory Visit: Payer: Self-pay

## 2019-10-26 ENCOUNTER — Other Ambulatory Visit (HOSPITAL_COMMUNITY)
Admission: RE | Admit: 2019-10-26 | Discharge: 2019-10-26 | Disposition: A | Discharge status: Home / Self Care | Payer: BC Managed Care – PPO | Source: Ambulatory Visit | Attending: Adult Health | Admitting: Adult Health

## 2019-10-26 VITALS — BP 134/80 | HR 92 | Ht 61.0 in | Wt 220.0 lb

## 2019-10-26 DIAGNOSIS — Z01419 Encounter for gynecological examination (general) (routine) without abnormal findings: Secondary | ICD-10-CM

## 2019-10-26 DIAGNOSIS — Z3041 Encounter for surveillance of contraceptive pills: Secondary | ICD-10-CM

## 2019-10-26 DIAGNOSIS — R87612 Low grade squamous intraepithelial lesion on cytologic smear of cervix (LGSIL): Secondary | ICD-10-CM | POA: Diagnosis not present

## 2019-10-26 NOTE — Progress Notes (Signed)
Patient ID: Morgan Franklin, female   DOB: [birth year], 24 y.o.   MRN: [id number] History of Present Illness: Morgan Franklin is a 24 year old white female,single, G0P0, in for a well woman gyn exam and pap. She has not had and BTB with Apri. She is having surgery 11/25/19 to remove granulation tissue from abdomen. PCP is Dr Margo Aye.   Current Medications, Allergies, Past Medical History, Past Surgical History, Family History and Social History were reviewed in [employer] record.     Review of Systems: Patient denies any headaches, hearing loss, fatigue, blurred vision, shortness of breath, chest pain, abdominal pain, problems with bowel movements, urination, or intercourse. No joint pain or mood swings.    Physical Exam:BP 134/80 (BP Location: Left Arm, Patient Position: Sitting, Cuff Size: Normal)   Pulse 92   Ht 5\' 1"  (1.549 m)   Wt 220 lb (99.8 kg)   LMP 10/09/2019   BMI 41.57 kg/m  General:  Well developed, well nourished, no acute distress Skin:  Warm and dry,she has dark hair on arms, abdomen and thighs, and some on chin Neck:  Midline trachea, normal thyroid, good ROM, no lymphadenopathy Lungs; Clear to auscultation bilaterally Breast:  No dominant palpable mass, retraction, or nipple discharge Cardiovascular: Regular rate and rhythm Abdomen:  Soft, non tender, no hepatosplenomegaly,has opening in crease with redness,she says it is granulation tissue and is having removed in August Pelvic:  External genitalia is normal in appearance, no lesions.  The vagina is normal in appearance. Urethra has no lesions or masses. The cervix is smooth, pap with GC/CHL performed.  Uterus is felt to be normal size, shape, and contour.  No adnexal masses or tenderness noted.Bladder is non tender, no masses felt. Rectal:Deferred Extremities/musculoskeletal:  No swelling or varicosities noted, no clubbing or cyanosis Psych:  No mood changes, alert and cooperative,seems happy AA is 2 Fall  risk is low PHQ 9 score is 0  Upstream - 10/26/19 1034      Pregnancy Intention Screening   Does the patient want to become pregnant in the next year? Unsure    Does the patient's partner want to become pregnant in the next year? Unsure    Would the patient like to discuss contraceptive options today? No      Contraception Wrap Up   Current Method Oral Contraceptive    End Method Oral Contraceptive    Contraception Counseling Provided No          Examination chaperoned by 10/28/19 LPN   Impression and Plan: 1. Encounter for gynecological examination with Papanicolaou smear of cervix Pap sent Physical in 1 year Pap in 3 if normal   2. Encounter for surveillance of contraceptive pills Continue Apri, has refills

## 2019-10-28 LAB — CYTOLOGY - PAP
Chlamydia: NEGATIVE
Comment: NEGATIVE
Comment: NORMAL
Neisseria Gonorrhea: NEGATIVE

## 2019-11-02 ENCOUNTER — Encounter: Payer: Self-pay | Admitting: Adult Health

## 2019-11-02 ENCOUNTER — Telehealth: Payer: Self-pay | Admitting: Adult Health

## 2019-11-02 DIAGNOSIS — R87612 Low grade squamous intraepithelial lesion on cytologic smear of cervix (LGSIL): Secondary | ICD-10-CM

## 2019-11-02 HISTORY — DX: Low grade squamous intraepithelial lesion on cytologic smear of cervix (LGSIL): R87.612

## 2019-11-02 NOTE — Telephone Encounter (Signed)
Pt aware that pap is abnormal, and needs colpo, wil get appt with Dr Emelda Fear or Dr Despina Hidden .

## 2019-11-21 ENCOUNTER — Other Ambulatory Visit: Payer: Self-pay

## 2019-11-21 ENCOUNTER — Ambulatory Visit (INDEPENDENT_AMBULATORY_CARE_PROVIDER_SITE_OTHER): Payer: BC Managed Care – PPO | Admitting: Obstetrics and Gynecology

## 2019-11-21 ENCOUNTER — Encounter: Payer: Self-pay | Admitting: Obstetrics and Gynecology

## 2019-11-21 VITALS — BP 147/101 | HR 114 | Ht 60.0 in | Wt 220.8 lb

## 2019-11-21 DIAGNOSIS — Z3202 Encounter for pregnancy test, result negative: Secondary | ICD-10-CM | POA: Diagnosis not present

## 2019-11-21 DIAGNOSIS — Z01818 Encounter for other preprocedural examination: Secondary | ICD-10-CM | POA: Diagnosis not present

## 2019-11-21 LAB — POCT URINE PREGNANCY: Preg Test, Ur: NEGATIVE

## 2019-11-21 NOTE — Progress Notes (Signed)
Patient ID: Morgan Franklin, female   DOB: [birth year], 24 y.o.   MRN: [id number]   Omari Koslosky 24 y.o. G0P0000 here for colposcopy for low-grade squamous intraepithelial neoplasia (LGSIL - encompassing HPV,mild dysplasia,CIN I) pap smear on 10/26/2019.  Discussed role for HPV in cervical dysplasia, need for surveillance.  Discussion: Discussed with pt progression of abnormal pap smear results. Pt advised that low risk +HPV, ASCUS and LSIL typically revert back to normal cells and treatment plan is typically yearly surveillance for 3 years. Pt advised that colposcopy is typically only indicated with ASCUS and LSIL with +HPV. Discussed with pt that high grade CIN II and III require treatment. Pt advised that it typically takes 5 years or more to go from high grade abnormality to cervical cancer.  At end of discussion, pt had opportunity to ask questions and has no further questions at this time.  Specific discussion of HPV as noted above. Greater than 50% was spent in counseling and coordination of care with the patient.  Total time greater than: 17 minutes.    Patient given informed consent, signed copy in the chart, time out was performed.  Placed in lithotomy position. Cervix viewed with speculum and colposcope after application of acetic acid.   Colposcopy adequate? Limited. Can visualize anterior lip of cervix well but cannot visualize posterior lip; No biopsies obtained.   ECC specimen not obtained.   Colposcopy IMPRESSION: Limited colposcopy.  Patient was given post procedure instructions. Will follow up pathology and manage accordingly.  Routine preventative health maintenance measures emphasized.  Plan: 1. Pap in 1 year.  By signing my name below, I, Pietro Cassis, attest that this documentation has been prepared under the direction and in the presence of Tilda Burrow, MD. Electronically Signed: Pietro Cassis, Medical Scribe. 11/21/19. 2:34 PM.  I personally performed the  services described in this documentation, which was SCRIBED in my presence. The recorded information has been reviewed and considered accurate. It has been edited as necessary during review. Tilda Burrow, MD

## 2019-11-25 DIAGNOSIS — L928 Other granulomatous disorders of the skin and subcutaneous tissue: Secondary | ICD-10-CM | POA: Diagnosis not present

## 2019-11-25 DIAGNOSIS — L929 Granulomatous disorder of the skin and subcutaneous tissue, unspecified: Secondary | ICD-10-CM | POA: Diagnosis not present

## 2019-11-25 HISTORY — PX: ABDOMINAL EXPLORATION SURGERY: SHX538

## 2019-12-20 DIAGNOSIS — U071 COVID-19: Secondary | ICD-10-CM | POA: Diagnosis not present

## 2019-12-27 DIAGNOSIS — U071 COVID-19: Secondary | ICD-10-CM | POA: Diagnosis not present

## 2020-02-20 DIAGNOSIS — M25572 Pain in left ankle and joints of left foot: Secondary | ICD-10-CM | POA: Diagnosis not present

## 2020-02-20 DIAGNOSIS — S93492A Sprain of other ligament of left ankle, initial encounter: Secondary | ICD-10-CM | POA: Diagnosis not present

## 2020-02-20 DIAGNOSIS — M7672 Peroneal tendinitis, left leg: Secondary | ICD-10-CM | POA: Diagnosis not present

## 2020-03-19 DIAGNOSIS — Z09 Encounter for follow-up examination after completed treatment for conditions other than malignant neoplasm: Secondary | ICD-10-CM | POA: Diagnosis not present

## 2020-03-20 DIAGNOSIS — T8131XD Disruption of external operation (surgical) wound, not elsewhere classified, subsequent encounter: Secondary | ICD-10-CM | POA: Diagnosis not present

## 2020-03-20 DIAGNOSIS — L929 Granulomatous disorder of the skin and subcutaneous tissue, unspecified: Secondary | ICD-10-CM | POA: Diagnosis not present

## 2020-04-02 DIAGNOSIS — L98492 Non-pressure chronic ulcer of skin of other sites with fat layer exposed: Secondary | ICD-10-CM | POA: Diagnosis not present

## 2020-04-13 DIAGNOSIS — L98492 Non-pressure chronic ulcer of skin of other sites with fat layer exposed: Secondary | ICD-10-CM | POA: Diagnosis not present

## 2020-04-26 ENCOUNTER — Encounter (HOSPITAL_BASED_OUTPATIENT_CLINIC_OR_DEPARTMENT_OTHER): Payer: BC Managed Care – PPO | Admitting: Internal Medicine

## 2020-04-27 DIAGNOSIS — L98492 Non-pressure chronic ulcer of skin of other sites with fat layer exposed: Secondary | ICD-10-CM | POA: Diagnosis not present

## 2020-05-04 DIAGNOSIS — L98492 Non-pressure chronic ulcer of skin of other sites with fat layer exposed: Secondary | ICD-10-CM | POA: Diagnosis not present

## 2020-05-07 DIAGNOSIS — S31109A Unspecified open wound of abdominal wall, unspecified quadrant without penetration into peritoneal cavity, initial encounter: Secondary | ICD-10-CM | POA: Diagnosis not present

## 2020-05-11 DIAGNOSIS — L98492 Non-pressure chronic ulcer of skin of other sites with fat layer exposed: Secondary | ICD-10-CM | POA: Diagnosis not present

## 2020-05-18 DIAGNOSIS — L905 Scar conditions and fibrosis of skin: Secondary | ICD-10-CM | POA: Diagnosis not present

## 2020-05-18 DIAGNOSIS — S31109D Unspecified open wound of abdominal wall, unspecified quadrant without penetration into peritoneal cavity, subsequent encounter: Secondary | ICD-10-CM | POA: Diagnosis not present

## 2020-05-25 DIAGNOSIS — L98492 Non-pressure chronic ulcer of skin of other sites with fat layer exposed: Secondary | ICD-10-CM | POA: Diagnosis not present

## 2020-06-08 DIAGNOSIS — L98492 Non-pressure chronic ulcer of skin of other sites with fat layer exposed: Secondary | ICD-10-CM | POA: Diagnosis not present

## 2020-06-11 DIAGNOSIS — S31109A Unspecified open wound of abdominal wall, unspecified quadrant without penetration into peritoneal cavity, initial encounter: Secondary | ICD-10-CM | POA: Diagnosis not present

## 2020-06-15 DIAGNOSIS — Z09 Encounter for follow-up examination after completed treatment for conditions other than malignant neoplasm: Secondary | ICD-10-CM | POA: Diagnosis not present

## 2020-06-28 DIAGNOSIS — J029 Acute pharyngitis, unspecified: Secondary | ICD-10-CM | POA: Diagnosis not present

## 2020-06-28 DIAGNOSIS — J069 Acute upper respiratory infection, unspecified: Secondary | ICD-10-CM | POA: Diagnosis not present

## 2020-06-28 DIAGNOSIS — R059 Cough, unspecified: Secondary | ICD-10-CM | POA: Diagnosis not present

## 2020-08-17 ENCOUNTER — Other Ambulatory Visit: Payer: Self-pay

## 2020-08-17 ENCOUNTER — Encounter: Payer: Self-pay | Admitting: Adult Health

## 2020-08-17 ENCOUNTER — Ambulatory Visit: Payer: BC Managed Care – PPO | Admitting: Adult Health

## 2020-08-17 VITALS — BP 149/96 | HR 85 | Ht 60.0 in | Wt 210.4 lb

## 2020-08-17 DIAGNOSIS — Z029 Encounter for administrative examinations, unspecified: Secondary | ICD-10-CM

## 2020-08-17 DIAGNOSIS — N921 Excessive and frequent menstruation with irregular cycle: Secondary | ICD-10-CM

## 2020-08-17 DIAGNOSIS — Z3041 Encounter for surveillance of contraceptive pills: Secondary | ICD-10-CM

## 2020-08-17 DIAGNOSIS — G43839 Menstrual migraine, intractable, without status migrainosus: Secondary | ICD-10-CM

## 2020-08-17 DIAGNOSIS — Z8742 Personal history of other diseases of the female genital tract: Secondary | ICD-10-CM

## 2020-08-17 DIAGNOSIS — N946 Dysmenorrhea, unspecified: Secondary | ICD-10-CM | POA: Diagnosis not present

## 2020-08-17 MED ORDER — LEVONORGEST-ETH ESTRAD 91-DAY 0.15-0.03 &0.01 MG PO TABS: 1.0000 | ORAL_TABLET | Freq: Every day | ORAL | 4 refills | Status: DC

## 2020-08-17 NOTE — Progress Notes (Signed)
  Subjective:     Patient ID: Morgan Franklin, female   DOB: Nov 11, 1995, 25 y.o.   MRN: [id number]  HPI Morgan Franklin is a 25 year old white female,single, G0P0, in complaining of irregular periods on Apri, missed 3, then having heavy painful periods, and menstrual headaches, has missed work, missed 1/2 day 08/14/20, and brought FMLA papers today.  She works from home for [employer]. Lab Results  Component Value Date   DIAGPAP - Low grade squamous intraepithelial lesion (LSIL) (A) 10/26/2019   HPV NOT DETECTED 10/22/2016   PCP is Dr Margo Aye  Review of Systems  +heavy painful periods +menstrual headaches +irregular periods,may skip  Reviewed past medical,surgical, social and family history. Reviewed medications and allergies.     Objective:   Physical Exam BP (!) 149/96 (BP Location: Left Arm, Patient Position: Sitting, Cuff Size: Normal)   Pulse 85   Ht 5' (1.524 m)   Wt 210 lb 6.4 oz (95.4 kg)   LMP 08/12/2020 (Exact Date)   BMI 41.09 kg/m    Skin warm and dry.  Lungs: clear to ausculation bilaterally. Cardiovascular: regular rate and rhythm.   Upstream - 08/17/20 0951      Pregnancy Intention Screening   Does the patient want to become pregnant in the next year? No    Does the patient's partner want to become pregnant in the next year? No    Would the patient like to discuss contraceptive options today? Yes      Contraception Wrap Up   Current Method Oral Contraceptive    End Method Oral Contraceptive    Contraception Counseling Provided Yes          Assessment:      1. Menorrhagia with irregular cycle Will change OCs to amethia  Meds ordered this encounter  Medications  . Levonorgestrel-Ethinyl Estradiol (AMETHIA) 0.15-0.03 &0.01 MG tablet    Sig: Take 1 tablet by mouth daily.    Dispense:  91 tablet    Refill:  4    Order Specific Question:   Supervising Provider    Answer:   Despina Hidden, LUTHER H [[id number]]   2. Dysmenorrhea   3. Intractable menstrual migraine without  status migrainosus   4. Encounter for surveillance of contraceptive pills    5. History of abnormal pap  Plan:     . Follow up in 4 weeks for ROS Make pap and physical appt after 10/25/20

## 2020-09-05 ENCOUNTER — Other Ambulatory Visit: Payer: Self-pay | Admitting: Adult Health

## 2020-09-13 ENCOUNTER — Ambulatory Visit: Payer: BC Managed Care – PPO | Admitting: Adult Health

## 2020-09-17 DIAGNOSIS — L98492 Non-pressure chronic ulcer of skin of other sites with fat layer exposed: Secondary | ICD-10-CM | POA: Diagnosis not present

## 2020-09-27 DIAGNOSIS — L98492 Non-pressure chronic ulcer of skin of other sites with fat layer exposed: Secondary | ICD-10-CM | POA: Diagnosis not present

## 2020-10-03 DIAGNOSIS — B999 Unspecified infectious disease: Secondary | ICD-10-CM | POA: Diagnosis not present

## 2020-10-05 DIAGNOSIS — L98492 Non-pressure chronic ulcer of skin of other sites with fat layer exposed: Secondary | ICD-10-CM | POA: Diagnosis not present

## 2020-10-11 DIAGNOSIS — L98492 Non-pressure chronic ulcer of skin of other sites with fat layer exposed: Secondary | ICD-10-CM | POA: Diagnosis not present

## 2020-10-12 ENCOUNTER — Other Ambulatory Visit: Payer: Self-pay

## 2020-10-12 ENCOUNTER — Ambulatory Visit (INDEPENDENT_AMBULATORY_CARE_PROVIDER_SITE_OTHER): Payer: BC Managed Care – PPO | Admitting: Adult Health

## 2020-10-12 ENCOUNTER — Encounter: Payer: Self-pay | Admitting: Adult Health

## 2020-10-12 VITALS — BP 133/86 | HR 93 | Ht 60.0 in | Wt 213.0 lb

## 2020-10-12 DIAGNOSIS — N946 Dysmenorrhea, unspecified: Secondary | ICD-10-CM | POA: Diagnosis not present

## 2020-10-12 DIAGNOSIS — N921 Excessive and frequent menstruation with irregular cycle: Secondary | ICD-10-CM

## 2020-10-12 DIAGNOSIS — G43839 Menstrual migraine, intractable, without status migrainosus: Secondary | ICD-10-CM

## 2020-10-12 DIAGNOSIS — Z3041 Encounter for surveillance of contraceptive pills: Secondary | ICD-10-CM

## 2020-10-12 NOTE — Progress Notes (Addendum)
  Subjective:     Patient ID: Morgan Franklin, female   DOB: 02-18-1996, 25 y.o.   MRN: [id number]  HPI Ha is a 25 year old white female,single, G0P0, back in follow up on starting amethia for menorrhagia, dysmenorrhea and headaches and is much better. She says she has not missed work as often. PCP is Dr Margo Aye.   Review of Systems No bleeding on amethia Cramping is better  Headaches not as often Reviewed past medical,surgical, social and family history. Reviewed medications and allergies.     Objective:   Physical Exam BP 133/86 (BP Location: Right Arm, Patient Position: Sitting, Cuff Size: Large)   Pulse 93   Ht 5' (1.524 m)   Wt 213 lb (96.6 kg)   BMI 41.60 kg/m  Skin warm and dry. Lungs: clear to ausculation bilaterally. Cardiovascular: regular rate and rhythm.  Fall risk is low  Upstream - 10/12/20 0911       Pregnancy Intention Screening   Does the patient want to become pregnant in the next year? No    Does the patient's partner want to become pregnant in the next year? No    Would the patient like to discuss contraceptive options today? No      Contraception Wrap Up   Current Method Oral Contraceptive    End Method Oral Contraceptive    Contraception Counseling Provided No                Assessment:     1. Encounter for surveillance of contraceptive pills Continue amethia, has refills   2. Menorrhagia with irregular cycle Has not had any bleeding  3. Dysmenorrhea Cramping is much better   4. Intractable menstrual migraine without status migrainosus Headaches better, not as often    Plan:     Return 11/02/20 for pap and physical

## 2020-10-22 DIAGNOSIS — L98492 Non-pressure chronic ulcer of skin of other sites with fat layer exposed: Secondary | ICD-10-CM | POA: Diagnosis not present

## 2020-11-02 ENCOUNTER — Ambulatory Visit (INDEPENDENT_AMBULATORY_CARE_PROVIDER_SITE_OTHER): Payer: BC Managed Care – PPO | Admitting: Adult Health

## 2020-11-02 ENCOUNTER — Other Ambulatory Visit (HOSPITAL_COMMUNITY)
Admission: RE | Admit: 2020-11-02 | Discharge: 2020-11-02 | Disposition: A | Discharge status: Home / Self Care | Payer: BC Managed Care – PPO | Source: Ambulatory Visit | Attending: Adult Health | Admitting: Adult Health

## 2020-11-02 ENCOUNTER — Encounter: Payer: Self-pay | Admitting: Adult Health

## 2020-11-02 ENCOUNTER — Other Ambulatory Visit: Payer: Self-pay

## 2020-11-02 VITALS — BP 127/88 | HR 79 | Ht 60.5 in | Wt 209.5 lb

## 2020-11-02 DIAGNOSIS — Z3041 Encounter for surveillance of contraceptive pills: Secondary | ICD-10-CM | POA: Diagnosis not present

## 2020-11-02 DIAGNOSIS — Z01419 Encounter for gynecological examination (general) (routine) without abnormal findings: Secondary | ICD-10-CM | POA: Diagnosis not present

## 2020-11-02 DIAGNOSIS — L98492 Non-pressure chronic ulcer of skin of other sites with fat layer exposed: Secondary | ICD-10-CM | POA: Diagnosis not present

## 2020-11-02 DIAGNOSIS — N921 Excessive and frequent menstruation with irregular cycle: Secondary | ICD-10-CM

## 2020-11-02 DIAGNOSIS — R87612 Low grade squamous intraepithelial lesion on cytologic smear of cervix (LGSIL): Secondary | ICD-10-CM | POA: Diagnosis not present

## 2020-11-02 DIAGNOSIS — N946 Dysmenorrhea, unspecified: Secondary | ICD-10-CM

## 2020-11-02 NOTE — Progress Notes (Signed)
Patient ID: Morgan Franklin, female   DOB: June 22, 1995, 25 y.o.   MRN: [id number] History of Present Illness: Morgan Franklin is a 25 year old white female,single, G0P0, in for a well woman gyn exam and pap.She had LSIL on pap last year. [facility] [place] on [facility]. Lab Results  Component Value Date   DIAGPAP - Low grade squamous intraepithelial lesion (LSIL) (A) 10/26/2019   HPV NOT DETECTED 10/22/2016      Current Medications, Allergies, Past Medical History, Past Surgical History, Family History and Social History were reviewed in [employer] record.     Review of Systems: Patient denies any headaches, hearing loss, fatigue, blurred vision, shortness of breath, chest pain, abdominal pain, problems with bowel movements, urination, or intercourse. No joint pain or mood swings.  No period yet on new OCs     Physical Exam:BP 127/88 (BP Location: Right Arm, Patient Position: Sitting, Cuff Size: Large)   Pulse 79   Ht 5' 0.5" (1.537 m)   Wt 209 lb 8 oz (95 kg)   BMI 40.24 kg/m   General:  Well developed, well nourished, no acute distress Skin:  Warm and dry Neck:  Midline trachea, normal thyroid, good ROM, no lymphadenopathy Lungs; Clear to auscultation bilaterally Breast:  No dominant palpable mass, retraction, or nipple discharge Cardiovascular: Regular rate and rhythm Abdomen:  Soft, non tender, no hepatosplenomegaly,has dressing in place. Pelvic:  External genitalia is normal in appearance, no lesions.  The vagina is normal in appearance. Urethra has no lesions or masses. The cervix is smooth, pap with HR HPV genotyping performed.  Uterus is felt to be normal size, shape, and contour.  No adnexal masses or tenderness noted.Bladder is non tender, no masses felt. Rectal: Deferred Extremities/musculoskeletal:  No swelling or varicosities noted, no clubbing or cyanosis Psych:  No mood changes, alert and cooperative,seems happy AA is  1 Fall risk is low Depression screen [facility] 2/9 11/02/2020 10/26/2019 10/22/2016  Decreased Interest 0 0 0  Down, Depressed, Hopeless 0 0 0  PHQ - 2 Score 0 0 0  Altered sleeping 0 0 -  Tired, decreased energy 1 0 -  Change in appetite 0 0 -  Feeling bad or failure about yourself  0 0 -  Trouble concentrating 0 0 -  Moving slowly or fidgety/restless 0 0 -  Suicidal thoughts 0 0 -  PHQ-9 Score 1 0 -    GAD 7 : Generalized Anxiety Score 11/02/2020 10/26/2019  Nervous, Anxious, on Edge 0 0  Control/stop worrying 0 0  Worry too much - different things 0 0  Trouble relaxing 0 0  Restless 0 0  Easily annoyed or irritable 0 0  Afraid - awful might happen 0 0  Total GAD 7 Score 0 0      Upstream - 11/02/20 0943       Pregnancy Intention Screening   Does the patient want to become pregnant in the next year? No    Does the patient's partner want to become pregnant in the next year? No    Would the patient like to discuss contraceptive options today? No      Contraception Wrap Up   Current Method Oral Contraceptive    End Method Oral Contraceptive    Contraception Counseling Provided No            Examination chaperoned by Malachy Mood LPN   Impression and Plan: 1. Encounter for gynecological examination  with Papanicolaou smear of cervix Pap sent  Physical in 1 year Pap in 3 if normal  2. LGSIL on Pap smear of cervix Pap sent with HR HPV genotyping   3. Encounter for surveillance of contraceptive pills,  Continue amethia, has refills Follow up in 3 months    4. Menorrhagia and dysmenorrhea

## 2020-11-07 ENCOUNTER — Telehealth: Payer: Self-pay | Admitting: Adult Health

## 2020-11-07 DIAGNOSIS — R87612 Low grade squamous intraepithelial lesion on cytologic smear of cervix (LGSIL): Secondary | ICD-10-CM

## 2020-11-07 LAB — CYTOLOGY - PAP
Comment: NEGATIVE
High risk HPV: NEGATIVE

## 2020-11-07 NOTE — Telephone Encounter (Signed)
Pt aware that pap LSIL, possible higher grade lesions, had colpo 11/2019 limited per JVF, will repeat colpo,appt with Joellyn Haff or Dr Charlotta Newton

## 2020-11-09 DIAGNOSIS — L98492 Non-pressure chronic ulcer of skin of other sites with fat layer exposed: Secondary | ICD-10-CM | POA: Diagnosis not present

## 2020-11-10 ENCOUNTER — Other Ambulatory Visit: Payer: Self-pay

## 2020-11-10 ENCOUNTER — Emergency Department (HOSPITAL_COMMUNITY): Payer: BC Managed Care – PPO

## 2020-11-10 ENCOUNTER — Emergency Department (HOSPITAL_COMMUNITY)
Admission: EM | Admit: 2020-11-10 | Discharge: 2020-11-10 | Disposition: A | Discharge status: Home / Self Care | Payer: BC Managed Care – PPO | Attending: Emergency Medicine | Admitting: Emergency Medicine

## 2020-11-10 ENCOUNTER — Encounter (HOSPITAL_COMMUNITY): Payer: Self-pay | Admitting: *Deleted

## 2020-11-10 DIAGNOSIS — Y9289 Other specified places as the place of occurrence of the external cause: Secondary | ICD-10-CM | POA: Insufficient documentation

## 2020-11-10 DIAGNOSIS — Y9379 Activity, other specified sports and athletics: Secondary | ICD-10-CM | POA: Insufficient documentation

## 2020-11-10 DIAGNOSIS — W2100XA Struck by hit or thrown ball, unspecified type, initial encounter: Secondary | ICD-10-CM | POA: Diagnosis not present

## 2020-11-10 DIAGNOSIS — M25511 Pain in right shoulder: Secondary | ICD-10-CM

## 2020-11-10 DIAGNOSIS — S4991XA Unspecified injury of right shoulder and upper arm, initial encounter: Secondary | ICD-10-CM | POA: Diagnosis not present

## 2020-11-10 MED ORDER — CYCLOBENZAPRINE HCL 10 MG PO TABS
10.0000 mg | ORAL_TABLET | Freq: Two times a day (BID) | ORAL | 0 refills | Status: DC | PRN
Start: 2020-11-10 — End: 2021-02-01

## 2020-11-10 NOTE — Discharge Instructions (Signed)
You have been seen here for right arm pain, imaging was reassuring.  I have also given you a prescription for a muscle relaxer this can make you drowsy do not consume alcohol or operate heavy machinery when taking this medication.  I recommend taking over-the-counter pain medications like ibuprofen and/or Tylenol every 6 as needed.  Please follow dosage and on the back of bottle.  I also recommend applying heat to the area and stretching out the muscles as this will help decrease stiffness and pain.  I have given you information on exercises please follow.  I have given you a sling he may wear during the day but please take off at nighttime.   Please follow-up with orthopedic surgery for further evaluation especially if symptoms improve after a week's time.  Come back to the emergency department if you develop chest pain, shortness of breath, severe abdominal pain, uncontrolled nausea, vomiting, diarrhea.

## 2020-11-10 NOTE — ED Provider Notes (Signed)
[facility] EMERGENCY DEPARTMENT Provider Note   CSN: [id number] Arrival date & time: 11/10/20  2000     History Chief Complaint  Patient presents with   Shoulder Pain    Morgan Franklin is a 25 y.o. female.  HPI  Patient with no significant medical history presents to the emergency department with chief complaint of right shoulder pain.  Patient states this started today, she was out playing kickball and was in the outfield attempted to catch a fly ball but the ball hit her extended arms pushing her arm backwards.  She states she heard a pop and had severe pain in her right bicep.  She denies hitting her head, losing conscious, is not on anticoagulant she states she has pain in her right bicep, hurts when she flexes or extends at the elbow, she denies shoulder pain, denies paresthesia or weakness in her fingers.  She denies alleviating factors, has no other complaints at this time.  She does not endorse headaches, fevers, chills, chest pain, shortness of breath.  Past Medical History:  Diagnosis Date   Androgen excess 10/14/2012   Has dark hair on arms, increase hair on back and belly and chin   Contraceptive management 06/27/2014   Irregular periods 10/14/2012   Will try OCs   LGSIL on Pap smear of cervix 11/02/2019   Possible higher grade lesion, will get colpo.   Menstrual extraction 01/14/2013   Periods more regular on the pill   Seasonal allergies     Patient Active Problem List   Diagnosis Date Noted   Intractable menstrual migraine without status migrainosus 08/17/2020   Dysmenorrhea 08/17/2020   Menorrhagia with irregular cycle 08/17/2020   History of abnormal cervical Pap smear 08/17/2020   LGSIL on Pap smear of cervix 11/02/2019   Irregular intermenstrual bleeding 09/26/2019   Menstrual cramps 09/26/2019   Abdominal cramping 07/10/2016   Encounter for surveillance of contraceptive pills 07/10/2016   Contraceptive management 06/27/2014   Encounter for gynecological  examination with Papanicolaou smear of cervix 01/14/2013   Irregular periods 10/14/2012   Androgen excess 10/14/2012    Past Surgical History:  Procedure Laterality Date   ABDOMINAL EXPLORATION SURGERY Bilateral 11/25/2019   GASTROSCHISIS CLOSURE     71 days old     OB History     Gravida  0   Para  0   Term  0   Preterm  0   AB  0   Living  0      SAB  0   IAB  0   Ectopic  0   Multiple  0   Live Births              Family History  Problem Relation Age of Onset   Hypertension Mother    Stroke Mother    Diabetes Father    Diabetes Paternal Grandmother    Diabetes Maternal Grandfather     Social History   Tobacco Use   Smoking status: Never   Smokeless tobacco: Never  Vaping Use   Vaping Use: Never used  Substance Use Topics   Alcohol use: Yes    Comment: very rarely   Drug use: No    Home Medications Prior to Admission medications   Medication Sig Start Date End Date Taking? Authorizing Provider  cyclobenzaprine (FLEXERIL) 10 MG tablet Take 1 tablet (10 mg total) by mouth 2 (two) times daily as needed for muscle spasms. 11/10/20  Yes Carroll Sage, PA-C  acetaminophen (TYLENOL)  500 MG tablet Take 1 tablet (500 mg total) by mouth every 6 (six) hours as needed. 10/13/19   Avegno, Zachery Dakins, FNP  Levonorgestrel-Ethinyl Estradiol (AMETHIA) 0.15-0.03 &0.01 MG tablet Take 1 tablet by mouth daily. 08/17/20   Adline Potter, NP    Allergies    Patient has no known allergies.  Review of Systems   Review of Systems  Constitutional:  Negative for chills and fever.  HENT:  Negative for congestion.   Respiratory:  Negative for shortness of breath.   Cardiovascular:  Negative for chest pain.  Gastrointestinal:  Negative for abdominal pain.  Genitourinary:  Negative for enuresis.  Musculoskeletal:  Negative for back pain.       Right bicep pain.  Skin:  Negative for rash.  Neurological:  Negative for dizziness.  Hematological:  Does not  bruise/bleed easily.   Physical Exam Updated Vital Signs BP (!) 154/89 (BP Location: Left Arm)   Pulse (!) 119   Temp 98.4 F (36.9 C) (Temporal)   Resp 18   Ht 5' 0.5" (1.537 m)   Wt 95.7 kg   LMP  (LMP Unknown)   SpO2 100%   BMI 40.53 kg/m   Physical Exam Vitals and nursing note reviewed.  Constitutional:      General: She is not in acute distress.    Appearance: She is not ill-appearing.  HENT:     Head: Normocephalic and atraumatic.     Nose: No congestion.  Eyes:     Conjunctiva/sclera: Conjunctivae normal.  Cardiovascular:     Rate and Rhythm: Normal rate and regular rhythm.     Pulses: Normal pulses.  Pulmonary:     Effort: Pulmonary effort is normal.  Musculoskeletal:        General: Tenderness present. No swelling or deformity.     Comments: Right arm was visualized there is no gross deformities present, she has full range of motion at her fingers wrist, able to slightly flex and extend at  the elbow this was limited due to pain, able to move her shoulder without difficulty.  Neurovascular fully intact, there is no gross deformities noted patient's elbow and/or shoulder, bicep was palpated no deformities noted, no tenderness at the insertion or origin sites of the bicep.  Skin:    General: Skin is warm and dry.  Neurological:     Mental Status: She is alert.  Psychiatric:        Mood and Affect: Mood normal.    ED Results / Procedures / Treatments   Labs (all labs ordered are listed, but only abnormal results are displayed) Labs Reviewed - No data to display  EKG None  Radiology DG Shoulder Right  Result Date: 11/10/2020 CLINICAL DATA:  Arm injury EXAM: RIGHT SHOULDER - 2+ VIEW COMPARISON:  None. FINDINGS: There is no evidence of fracture or dislocation. There is no evidence of arthropathy or other focal bone abnormality. Soft tissues are unremarkable. IMPRESSION: Negative. Electronically Signed   By: Charlett Nose M.D.   On: 11/10/2020 21:05   DG Humerus  Right  Result Date: 11/10/2020 CLINICAL DATA:  Arm injury, pain EXAM: RIGHT HUMERUS - 2+ VIEW COMPARISON:  None. FINDINGS: There is no evidence of fracture or other focal bone lesions. Soft tissues are unremarkable. IMPRESSION: Negative. Electronically Signed   By: Charlett Nose M.D.   On: 11/10/2020 21:05    Procedures Procedures   Medications Ordered in ED Medications - No data to display  ED Course  I have  reviewed the triage vital signs and the nursing notes.  Pertinent labs & imaging results that were available during my care of the patient were reviewed by me and considered in my medical decision making (see chart for details).    MDM Rules/Calculators/A&P                          Initial impression-presents with right arm pain.  She is alert, does not appear in distress, vital signs reassuring.  Triage obtained imaging.  Work-up-DG of humerus and shoulder both negative for acute findings.  Rule out- I have low suspicion for septic arthritis as patient denies IV drug use, skin exam was performed no erythematous, edematous, warm joints noted on exam, no new heart murmur heard on exam.  Low suspicion for fracture or dislocation as x-ray does not feel any significant findings. low suspicion for ligament or tendon damage as area was palpated no gross defects noted, they had full range of motion as well as 5/5 strength.  Low suspicion for compartment syndrome as area was palpated it was soft to the touch, neurovascular fully intact.  Suspicion for bicep tear as there is no noted tenderness at the insertion of origin site, no mass noted on exam.     Plan-  Right arm pain-suspect this is a muscular strain cannot exclude the possibility of a small ligament tear, will place her in a sling, recommend over-the-counter pain medication, provide with muscle relaxers, have her follow-up with orthopedic surgery for further evaluation.  Vital signs have remained stable, no indication for hospital  admission.  Patient given at home care as well strict return precautions.  Patient verbalized that they understood agreed to said plan.  Final Clinical Impression(s) / ED Diagnoses Final diagnoses:  Acute pain of right shoulder    Rx / DC Orders ED Discharge Orders          Ordered    cyclobenzaprine (FLEXERIL) 10 MG tablet  2 times daily PRN        11/10/20 2212             Carroll Sage, PA-C 11/10/20 2215    Bethann Berkshire, MD 11/11/20 [phone]

## 2020-11-10 NOTE — ED Triage Notes (Signed)
Pt had a giant yoga balls hit her upper right arm and pt heard pop sound.  Pt with limited movement to right arm.

## 2020-11-10 NOTE — ED Notes (Signed)
MSE signature not gotten due to pt is right handed and right arm injury

## 2020-11-14 DIAGNOSIS — B372 Candidiasis of skin and nail: Secondary | ICD-10-CM | POA: Diagnosis not present

## 2020-11-14 DIAGNOSIS — L304 Erythema intertrigo: Secondary | ICD-10-CM | POA: Diagnosis not present

## 2020-11-16 DIAGNOSIS — L98492 Non-pressure chronic ulcer of skin of other sites with fat layer exposed: Secondary | ICD-10-CM | POA: Diagnosis not present

## 2020-11-19 ENCOUNTER — Encounter: Payer: BC Managed Care – PPO | Admitting: Women's Health

## 2020-11-22 ENCOUNTER — Encounter: Payer: Self-pay | Admitting: Orthopaedic Surgery

## 2020-11-22 ENCOUNTER — Ambulatory Visit: Payer: BC Managed Care – PPO | Admitting: Orthopaedic Surgery

## 2020-11-22 ENCOUNTER — Other Ambulatory Visit: Payer: Self-pay

## 2020-11-22 VITALS — BP 165/99 | HR 96 | Ht 60.5 in | Wt 208.0 lb

## 2020-11-22 DIAGNOSIS — M25511 Pain in right shoulder: Secondary | ICD-10-CM

## 2020-11-22 NOTE — Progress Notes (Signed)
Subjective:    Patient ID: Morgan Franklin, female    DOB: 1995/08/04, 25 y.o.   MRN: [id number]  HPI She has had pain in the right shoulder since first of August. She went to the ER on 11-10-20.  X-rays were done and were negative.  She has problem of raising her shoulder over head and laying on it. She has no trauma, no redness, no numbness.  Nothing seems to help it.  She cannot take NSAIDs.  I have reviewed the ER notes.  I have independently reviewed and interpreted x-rays of this patient done at another site by another physician or qualified health professional.    Review of Systems  Constitutional:  Positive for activity change.  Musculoskeletal:  Positive for arthralgias.  All other systems reviewed and are negative. For Review of Systems, all other systems reviewed and are negative.  The following is a summary of the past history medically, past history surgically, known current medicines, social history and family history.  This information is gathered electronically by the computer from prior information and documentation.  I review this each visit and have found including this information at this point in the chart is beneficial and informative.   Past Medical History:  Diagnosis Date   Androgen excess 10/14/2012   Has dark hair on arms, increase hair on back and belly and chin   Contraceptive management 06/27/2014   Irregular periods 10/14/2012   Will try OCs   LGSIL on Pap smear of cervix 11/02/2019   Possible higher grade lesion, will get colpo.   Menstrual extraction 01/14/2013   Periods more regular on the pill   Seasonal allergies     Past Surgical History:  Procedure Laterality Date   ABDOMINAL EXPLORATION SURGERY Bilateral 11/25/2019   GASTROSCHISIS CLOSURE     58 days old    Current Outpatient Medications on File Prior to Visit  Medication Sig Dispense Refill   acetaminophen (TYLENOL) 500 MG tablet Take 1 tablet (500 mg total) by mouth every 6 (six) hours  as needed. 30 tablet 0   cyclobenzaprine (FLEXERIL) 10 MG tablet Take 1 tablet (10 mg total) by mouth 2 (two) times daily as needed for muscle spasms. 20 tablet 0   Levonorgestrel-Ethinyl Estradiol (AMETHIA) 0.15-0.03 &0.01 MG tablet Take 1 tablet by mouth daily. 91 tablet 4   No current facility-administered medications on file prior to visit.    Social History   Socioeconomic History   Marital status: Single    Spouse name: Not on file   Number of children: Not on file   Years of education: Not on file   Highest education level: Not on file  Occupational History   Not on file  Tobacco Use   Smoking status: Never   Smokeless tobacco: Never  Vaping Use   Vaping Use: Never used  Substance and Sexual Activity   Alcohol use: Yes    Comment: very rarely   Drug use: No   Sexual activity: Yes    Birth control/protection: Pill  Other Topics Concern   Not on file  Social History Narrative   Not on file   Social Determinants of Health   Financial Resource Strain: Low Risk    Difficulty of Paying Living Expenses: Not hard at all  Food Insecurity: No Food Insecurity   Worried About Programme researcher, broadcasting/film/video in the Last Year: Never true   Ran Out of Food in the Last Year: Never true  Transportation Needs: No Transportation Needs  Lack of Transportation (Medical): No   Lack of Transportation (Non-Medical): No  Physical Activity: Inactive   Days of Exercise per Week: 0 days   Minutes of Exercise per Session: 0 min  Stress: No Stress Concern Present   Feeling of Stress : Only a little  Social Connections: Moderately Isolated   Frequency of Communication with Friends and Family: More than three times a week   Frequency of Social Gatherings with Friends and Family: More than three times a week   Attends Religious Services: 1 to 4 times per year   Active Member of [employer] or Organizations: No   Attends Banker Meetings: Never   Marital Status: Never married  Careers information officer Violence: Not At Risk   Fear of Current or Ex-Partner: No   Emotionally Abused: No   Physically Abused: No   Sexually Abused: No    Family History  Problem Relation Age of Onset   Hypertension Mother    Stroke Mother    Diabetes Father    Diabetes Paternal Grandmother    Diabetes Maternal Grandfather     BP (!) 165/99   Pulse 96   Ht 5' 0.5" (1.537 m)   Wt 208 lb (94.3 kg)   LMP  (LMP Unknown)   BMI 39.95 kg/m   Body mass index is 39.95 kg/m.     Objective:   Physical Exam Vitals and nursing note reviewed. Exam conducted with a chaperone present.  Constitutional:      Appearance: She is well-developed.  HENT:     Head: Normocephalic and atraumatic.  Eyes:     Conjunctiva/sclera: Conjunctivae normal.     Pupils: Pupils are equal, round, and reactive to light.  Cardiovascular:     Rate and Rhythm: Normal rate and regular rhythm.  Pulmonary:     Effort: Pulmonary effort is normal.  Abdominal:     Palpations: Abdomen is soft.  Musculoskeletal:       Arms:     Cervical back: Normal range of motion and neck supple.  Skin:    General: Skin is warm and dry.  Neurological:     Mental Status: She is alert and oriented to person, place, and time.     Cranial Nerves: No cranial nerve deficit.     Motor: No abnormal muscle tone.     Coordination: Coordination normal.     Deep Tendon Reflexes: Reflexes are normal and symmetric. Reflexes normal.  Psychiatric:        Behavior: Behavior normal.        Thought Content: Thought content normal.        Judgment: Judgment normal.          Assessment & Plan:   Encounter Diagnosis  Name Primary?   Acute pain of right shoulder Yes   Begin PT/OT for the shoulder on the right.  PROCEDURE NOTE:  The patient request injection, verbal consent was obtained.  The right shoulder was prepped appropriately after time out was performed.   Sterile technique was observed and injection of 1 cc of Celestone 6 mg with  several cc's of plain xylocaine. Anesthesia was provided by ethyl chloride and a 20-gauge needle was used to inject the shoulder area. A posterior approach was used.  The injection was tolerated well.  A band aid dressing was applied.  The patient was advised to apply ice later today and tomorrow to the injection sight as needed.   Return in three weeks.  Call if any  problem.  Precautions discussed.  Electronically Signed Darreld Mclean, MD 8/18/202210:58 AM

## 2020-11-28 DIAGNOSIS — L905 Scar conditions and fibrosis of skin: Secondary | ICD-10-CM | POA: Diagnosis not present

## 2020-11-28 DIAGNOSIS — L739 Follicular disorder, unspecified: Secondary | ICD-10-CM | POA: Diagnosis not present

## 2020-11-28 DIAGNOSIS — B9689 Other specified bacterial agents as the cause of diseases classified elsewhere: Secondary | ICD-10-CM | POA: Diagnosis not present

## 2020-11-28 DIAGNOSIS — L02221 Furuncle of abdominal wall: Secondary | ICD-10-CM | POA: Diagnosis not present

## 2020-11-30 DIAGNOSIS — L98492 Non-pressure chronic ulcer of skin of other sites with fat layer exposed: Secondary | ICD-10-CM | POA: Diagnosis not present

## 2020-12-07 ENCOUNTER — Encounter: Payer: Self-pay | Admitting: Women's Health

## 2020-12-07 ENCOUNTER — Other Ambulatory Visit (HOSPITAL_COMMUNITY)
Admission: RE | Admit: 2020-12-07 | Discharge: 2020-12-07 | Disposition: A | Discharge status: Home / Self Care | Payer: BC Managed Care – PPO | Source: Ambulatory Visit | Attending: Women's Health | Admitting: Women's Health

## 2020-12-07 ENCOUNTER — Other Ambulatory Visit: Payer: Self-pay

## 2020-12-07 ENCOUNTER — Ambulatory Visit (INDEPENDENT_AMBULATORY_CARE_PROVIDER_SITE_OTHER): Payer: BC Managed Care – PPO | Admitting: Women's Health

## 2020-12-07 VITALS — BP 148/104 | HR 81 | Ht 60.0 in | Wt 210.0 lb

## 2020-12-07 DIAGNOSIS — Z3202 Encounter for pregnancy test, result negative: Secondary | ICD-10-CM

## 2020-12-07 DIAGNOSIS — N870 Mild cervical dysplasia: Secondary | ICD-10-CM | POA: Diagnosis not present

## 2020-12-07 DIAGNOSIS — R87612 Low grade squamous intraepithelial lesion on cytologic smear of cervix (LGSIL): Secondary | ICD-10-CM | POA: Diagnosis not present

## 2020-12-07 LAB — POCT URINE PREGNANCY: Preg Test, Ur: NEGATIVE

## 2020-12-07 NOTE — Patient Instructions (Signed)
Colposcopy Colposcopy is a procedure to examine the lowest part of the uterus (cervix), the vagina, and the area around the vaginal opening (vulva) for abnormalities or signs of disease. This procedure is done using an instrument that makes objects appear larger and provides light. (colposcope). During the procedure, the health care provider may remove a tissue sample to be looked at later under a microscope (biopsy). A biopsy may be done if any unusual cells are found during the colposcopy. You may have a colposcopy if you have: An abnormal Pap smear, also called a Pap test. This screening test is used to check for signs of cancer or infection of the vagina, cervix, and uterus. An HPV (human papillomavirus) test and get a positive result for a type of HPV that puts you at high risk of cancer. Certain conditions or symptoms, such as: A sore, or lesion, on your cervix. Genital warts on your vulva, vagina, or cervix. Pain during sex. Vaginal bleeding, especially after sex. A growth on your cervix (cervical polyp) that needs to be removed. Let your health care provider know about: Any allergies you have, including allergies to medicines, latex, or iodine. All medicines you are taking, including vitamins, herbs, eye drops, creams, and over-the-counter medicines. Any problems you or family members have had with anesthetic medicines. Any blood disorders you have. Any surgeries you have had. Any medical conditions you have, such as pelvic inflammatory disease (PID) or endometrial disorder. The pattern of your menstrual cycles and the form of birth control (contraception) you use, if any. Your medical history, including any history of fainting often or of cervical treatment. Whether you are pregnant or may be pregnant. What are the risks? Generally, this is a safe procedure. However, problems may occur, including: Infection. Symptoms of infection may include fever, bad-smelling vaginal discharge, or  pelvic pain. Allergic reactions to medicines. Damage to nearby structures or organs. Fainting. This is rare. What happens before the procedure? Eating and drinking restrictions Follow instructions from your health care provider about eating or drinking restrictions. You will likely need to eat a regular diet the day of the procedure and not skip any meals. Tests You may have an exam or testing. A pregnancy test will be done the day of the procedure. You may have a blood or urine sample taken. General instructions Ask your health care provider about: Changing or stopping your regular medicines. This is especially important if you are taking diabetes medicines or blood thinners. Taking medicines such as aspirin and ibuprofen. These medicines can thin your blood. Do not take these medicines unless your health care provider tells you to take them. Your health care provider will likely tell you to avoid taking aspirin, or medicine that contains aspirin, for 7 days before the procedure. Taking over-the-counter medicines, vitamins, herbs, and supplements. Tell your health care provider if you have your menstrual period now or will have it at the time of your procedure. A colposcopy is not normally done during your menstrual period. If you use contraception, continue to use it before your procedure. For 24 hours before the procedure: Do not use douche products or tampons. Do not use medicines, creams, or suppositories in the vagina. Do not have sex. Ask your health care provider what steps will be taken to prevent infection. What happens during the procedure? You will lie down on your back, with your feet in foot rests (stirrups). A tool called a speculum will be warmed and will have oil or gel put on   it (will be lubricated). The speculum will then be inserted into your vagina. This will be used to hold apart the walls of your vagina so your health care provider can see your cervix and the inside of  your vagina. A cotton swab will be used to place a small amount of a liquid (solution) on the areas to be examined. This solution makes it easier to see abnormal cells. You may feel a slight burning during this part. The colposcope will be used to scan the cervix with a bright white light. The colposcope will be held near your vulva and will make your vulva, vagina, and cervix look bigger so they can be seen better. If a biopsy is needed: You may be given a medicine to numb the area (local anesthetic). Surgical tools will be used to remove mucus and cells through your vagina. You may feel mild pain while the tissue sample is removed. Bleeding may occur. A solution may be used to stop the bleeding. If a biopsy is needed from the inside of the cervix, a different procedure called endocervical curettage (ECC) may be done. During this procedure, a curved tool called a curette will be used to scrape cells from your cervix or the top of your cervix (endocervix). Any abnormalities that are found will be recorded. The procedure may vary among health care providers and hospitals. What happens after the procedure? You will lie down and rest for a few minutes. You may be offered juice or cookies. Your blood pressure, heart rate, breathing rate, and blood oxygen level will be monitored until you leave the hospital or clinic. You may have some cramping in your abdomen. This should go away after a few minutes. It is up to you to get the results of your procedure. Ask your health care provider, or the department that is doing the procedure, when your results will be ready. Summary Colposcopy is a procedure to examine the lowest part of the uterus (cervix), the vagina, and the area around the vaginal opening (vulva) for abnormalities or signs of disease. A biopsy may be done as part of the procedure. After the procedure, you will remain lying down and will rest for a few minutes. You may have some cramping in  your abdomen. This should go away after a few minutes. This information is not intended to replace advice given to you by your health care provider. Make sure you discuss any questions you have with your health care provider. Document Revised: 03/23/2019 Document Reviewed: 03/23/2019 Elsevier Patient Education  2022 Elsevier Inc.  

## 2020-12-07 NOTE — Addendum Note (Signed)
Addended by: Moss Mc on: 12/07/2020 01:44 PM   Modules accepted: Orders

## 2020-12-07 NOTE — Progress Notes (Signed)
   COLPOSCOPY PROCEDURE NOTE Patient name: Teneka Malmberg MRN [id number]  Date of birth: 02-25-1996 Subjective Findings:   Maziah Smola is a 25 y.o. G0P0000 Caucasian female being seen today for a colposcopy. Indication: Abnormal pap on 11/02/20: LSIL-H w/ HRHPV negative  Prior cytology:  Date Result Procedure  10/26/19 LSIL-H w/ HRHPV not done Colposcopy: limited view/no bx  2018 NILM w/ HRHPV negative None          Patient's last menstrual period was 11/17/2020. Contraception: OCP (estrogen/progesterone). Menopausal: no. Hysterectomy: no.   Smoker: no. Immunocompromised: no.   The risks and benefits were explained and informed consent was obtained, and written copy is in chart. Pertinent History Reviewed:   Reviewed past medical,surgical, social, obstetrical and family history.  Reviewed problem list, medications and allergies. Objective Findings & Procedure:   Vitals:   12/07/20 1255  BP: (!) 148/104  Pulse: 81  Weight: 210 lb (95.3 kg)  Height: 5' (1.524 m)  Body mass index is 41.01 kg/m.  Results for orders placed or performed in visit on 12/07/20 (from the past 24 hour(s))  POCT urine pregnancy   Collection Time: 12/07/20  1:11 PM  Result Value Ref Range   Preg Test, Ur Negative Negative     Time out was performed.  Speculum placed in the vagina, cervix fully visualized. SCJ: fully visualized. Cervix swabbed x 3 with acetic acid.  Acetowhitening present: Yes Cervix: no visible lesions, no mosaicism, no punctation, no abnormal vasculature, and light small acetowhite lesion(s) noted at 1 & 3 o'clock. Cervical biopsies taken at 1 & 3 o'clock and Hemostasis achieved with Monsel's solution. Vagina: vaginal colposcopy not performed Vulva: vulvar colposcopy not performed  Specimens: 2  Complications: none  Chaperone: Jobe Marker    Colposcopic Impression & Plan:   Colposcopy findings consistent with LSIL Plan: Post biopsy instructions given, Will notify patient  of results when back, and Will base plan of care on pathology results and ASCCP guidelines  Return in about 1 year (around 12/07/2021) for Pap & physical.  Cheral Marker CNM, WHNP-BC 12/07/2020 1:40 PM

## 2020-12-13 ENCOUNTER — Ambulatory Visit: Payer: BC Managed Care – PPO | Admitting: Orthopaedic Surgery

## 2020-12-13 LAB — SURGICAL PATHOLOGY

## 2020-12-14 DIAGNOSIS — L98492 Non-pressure chronic ulcer of skin of other sites with fat layer exposed: Secondary | ICD-10-CM | POA: Diagnosis not present

## 2020-12-17 ENCOUNTER — Ambulatory Visit (HOSPITAL_COMMUNITY): Payer: BC Managed Care – PPO | Admitting: Physical Therapy

## 2020-12-19 ENCOUNTER — Encounter (HOSPITAL_COMMUNITY): Payer: BC Managed Care – PPO | Admitting: Physical Therapy

## 2020-12-24 ENCOUNTER — Encounter (HOSPITAL_COMMUNITY): Payer: BC Managed Care – PPO | Admitting: Physical Therapy

## 2020-12-25 DIAGNOSIS — L98492 Non-pressure chronic ulcer of skin of other sites with fat layer exposed: Secondary | ICD-10-CM | POA: Diagnosis not present

## 2020-12-26 ENCOUNTER — Encounter (HOSPITAL_COMMUNITY): Payer: BC Managed Care – PPO | Admitting: Physical Therapy

## 2020-12-28 DIAGNOSIS — L98492 Non-pressure chronic ulcer of skin of other sites with fat layer exposed: Secondary | ICD-10-CM | POA: Diagnosis not present

## 2021-01-01 ENCOUNTER — Encounter (HOSPITAL_COMMUNITY): Payer: BC Managed Care – PPO | Admitting: Physical Therapy

## 2021-01-02 DIAGNOSIS — L98499 Non-pressure chronic ulcer of skin of other sites with unspecified severity: Secondary | ICD-10-CM | POA: Diagnosis not present

## 2021-01-03 ENCOUNTER — Encounter (HOSPITAL_COMMUNITY): Payer: BC Managed Care – PPO | Admitting: Physical Therapy

## 2021-01-07 ENCOUNTER — Encounter (HOSPITAL_COMMUNITY): Payer: BC Managed Care – PPO | Admitting: Physical Therapy

## 2021-01-09 ENCOUNTER — Encounter (HOSPITAL_COMMUNITY): Payer: BC Managed Care – PPO | Admitting: Physical Therapy

## 2021-01-11 DIAGNOSIS — L98492 Non-pressure chronic ulcer of skin of other sites with fat layer exposed: Secondary | ICD-10-CM | POA: Diagnosis not present

## 2021-01-14 ENCOUNTER — Encounter (HOSPITAL_COMMUNITY): Payer: BC Managed Care – PPO | Admitting: Physical Therapy

## 2021-01-16 ENCOUNTER — Encounter (HOSPITAL_COMMUNITY): Payer: BC Managed Care – PPO | Admitting: Physical Therapy

## 2021-01-21 ENCOUNTER — Encounter (HOSPITAL_COMMUNITY): Payer: BC Managed Care – PPO | Admitting: Physical Therapy

## 2021-01-23 ENCOUNTER — Encounter (HOSPITAL_COMMUNITY): Payer: BC Managed Care – PPO | Admitting: Physical Therapy

## 2021-01-25 DIAGNOSIS — L98492 Non-pressure chronic ulcer of skin of other sites with fat layer exposed: Secondary | ICD-10-CM | POA: Diagnosis not present

## 2021-02-01 ENCOUNTER — Encounter: Payer: Self-pay | Admitting: Adult Health

## 2021-02-01 ENCOUNTER — Ambulatory Visit (INDEPENDENT_AMBULATORY_CARE_PROVIDER_SITE_OTHER): Payer: BC Managed Care – PPO | Admitting: Adult Health

## 2021-02-01 ENCOUNTER — Other Ambulatory Visit: Payer: Self-pay

## 2021-02-01 VITALS — BP 155/98 | HR 100 | Ht 60.5 in | Wt 208.0 lb

## 2021-02-01 DIAGNOSIS — N946 Dysmenorrhea, unspecified: Secondary | ICD-10-CM

## 2021-02-01 DIAGNOSIS — R030 Elevated blood-pressure reading, without diagnosis of hypertension: Secondary | ICD-10-CM

## 2021-02-01 DIAGNOSIS — Z3041 Encounter for surveillance of contraceptive pills: Secondary | ICD-10-CM | POA: Diagnosis not present

## 2021-02-01 NOTE — Progress Notes (Signed)
  Subjective:     Patient ID: Morgan Franklin, female   DOB: 05/04/1995, 25 y.o.   MRN: [id number]  HPI [name] is a 25 year old white female,single, G0P0 in for follow up on taking Amethia for dysmenorrhea and has helped has had 1 episode, since starting and not has bad. PCP is Dr Margo Aye.  Review of Systems Has pain with periods, but since starting Amethia it is better Reviewed past medical,surgical, social and family history. Reviewed medications and allergies.     Objective:   Physical Exam BP (!) 155/98 (BP Location: Left Arm, Patient Position: Sitting, Cuff Size: Normal)   Pulse 100   Ht 5' 0.5" (1.537 m)   Wt 208 lb (94.3 kg)   BMI 39.95 kg/m     Skin warm and dry.  Lungs: clear to ausculation bilaterally. Cardiovascular: regular rate and rhythm.   Upstream - 02/01/21 0902       Pregnancy Intention Screening   Does the patient want to become pregnant in the next year? No    Does the patient's partner want to become pregnant in the next year? No    Would the patient like to discuss contraceptive options today? No      Contraception Wrap Up   Current Method Oral Contraceptive    End Method Oral Contraceptive    Contraception Counseling Provided No             Assessment:     1. Encounter for surveillance of contraceptive pills Continue Amethia, has refills  2. Dysmenorrhea Stay on Amethia Has FMLA, to bring forms in November   3. Elevated BP without diagnosis of hypertension Keep check on BP at home   4. White coat syndrome with high blood pressure but without hypertension     Plan:     Follow up in 6 months

## 2021-02-08 DIAGNOSIS — L98492 Non-pressure chronic ulcer of skin of other sites with fat layer exposed: Secondary | ICD-10-CM | POA: Diagnosis not present

## 2021-02-19 DIAGNOSIS — S134XXA Sprain of ligaments of cervical spine, initial encounter: Secondary | ICD-10-CM | POA: Diagnosis not present

## 2021-02-19 DIAGNOSIS — S43402A Unspecified sprain of left shoulder joint, initial encounter: Secondary | ICD-10-CM | POA: Diagnosis not present

## 2021-02-21 DIAGNOSIS — S43402A Unspecified sprain of left shoulder joint, initial encounter: Secondary | ICD-10-CM | POA: Diagnosis not present

## 2021-02-21 DIAGNOSIS — S134XXA Sprain of ligaments of cervical spine, initial encounter: Secondary | ICD-10-CM | POA: Diagnosis not present

## 2021-02-22 DIAGNOSIS — Y838 Other surgical procedures as the cause of abnormal reaction of the patient, or of later complication, without mention of misadventure at the time of the procedure: Secondary | ICD-10-CM | POA: Diagnosis not present

## 2021-02-22 DIAGNOSIS — L98492 Non-pressure chronic ulcer of skin of other sites with fat layer exposed: Secondary | ICD-10-CM | POA: Diagnosis not present

## 2021-02-22 DIAGNOSIS — S31109D Unspecified open wound of abdominal wall, unspecified quadrant without penetration into peritoneal cavity, subsequent encounter: Secondary | ICD-10-CM | POA: Diagnosis not present

## 2021-02-26 DIAGNOSIS — L98492 Non-pressure chronic ulcer of skin of other sites with fat layer exposed: Secondary | ICD-10-CM | POA: Diagnosis not present

## 2021-03-08 DIAGNOSIS — L98492 Non-pressure chronic ulcer of skin of other sites with fat layer exposed: Secondary | ICD-10-CM | POA: Diagnosis not present

## 2021-03-15 DIAGNOSIS — L98492 Non-pressure chronic ulcer of skin of other sites with fat layer exposed: Secondary | ICD-10-CM | POA: Diagnosis not present

## 2021-03-15 DIAGNOSIS — S31109D Unspecified open wound of abdominal wall, unspecified quadrant without penetration into peritoneal cavity, subsequent encounter: Secondary | ICD-10-CM | POA: Diagnosis not present

## 2021-03-15 DIAGNOSIS — Y838 Other surgical procedures as the cause of abnormal reaction of the patient, or of later complication, without mention of misadventure at the time of the procedure: Secondary | ICD-10-CM | POA: Diagnosis not present

## 2021-03-22 DIAGNOSIS — L98492 Non-pressure chronic ulcer of skin of other sites with fat layer exposed: Secondary | ICD-10-CM | POA: Diagnosis not present

## 2021-03-29 DIAGNOSIS — L98492 Non-pressure chronic ulcer of skin of other sites with fat layer exposed: Secondary | ICD-10-CM | POA: Diagnosis not present

## 2021-04-05 DIAGNOSIS — L98492 Non-pressure chronic ulcer of skin of other sites with fat layer exposed: Secondary | ICD-10-CM | POA: Diagnosis not present

## 2021-04-12 DIAGNOSIS — L98492 Non-pressure chronic ulcer of skin of other sites with fat layer exposed: Secondary | ICD-10-CM | POA: Diagnosis not present

## 2021-04-16 DIAGNOSIS — R0981 Nasal congestion: Secondary | ICD-10-CM | POA: Diagnosis not present

## 2021-04-16 DIAGNOSIS — J069 Acute upper respiratory infection, unspecified: Secondary | ICD-10-CM | POA: Diagnosis not present

## 2021-04-16 DIAGNOSIS — R059 Cough, unspecified: Secondary | ICD-10-CM | POA: Diagnosis not present

## 2021-04-19 DIAGNOSIS — L98492 Non-pressure chronic ulcer of skin of other sites with fat layer exposed: Secondary | ICD-10-CM | POA: Diagnosis not present

## 2021-04-26 DIAGNOSIS — S31109D Unspecified open wound of abdominal wall, unspecified quadrant without penetration into peritoneal cavity, subsequent encounter: Secondary | ICD-10-CM | POA: Diagnosis not present

## 2021-04-26 DIAGNOSIS — L98492 Non-pressure chronic ulcer of skin of other sites with fat layer exposed: Secondary | ICD-10-CM | POA: Diagnosis not present

## 2021-05-07 DIAGNOSIS — L98492 Non-pressure chronic ulcer of skin of other sites with fat layer exposed: Secondary | ICD-10-CM | POA: Diagnosis not present

## 2021-05-07 DIAGNOSIS — L304 Erythema intertrigo: Secondary | ICD-10-CM | POA: Diagnosis not present

## 2021-05-07 DIAGNOSIS — L905 Scar conditions and fibrosis of skin: Secondary | ICD-10-CM | POA: Diagnosis not present

## 2021-05-07 DIAGNOSIS — Z5181 Encounter for therapeutic drug level monitoring: Secondary | ICD-10-CM | POA: Diagnosis not present

## 2021-05-10 DIAGNOSIS — L98492 Non-pressure chronic ulcer of skin of other sites with fat layer exposed: Secondary | ICD-10-CM | POA: Diagnosis not present

## 2021-05-22 DIAGNOSIS — B999 Unspecified infectious disease: Secondary | ICD-10-CM | POA: Diagnosis not present

## 2021-05-31 DIAGNOSIS — L98492 Non-pressure chronic ulcer of skin of other sites with fat layer exposed: Secondary | ICD-10-CM | POA: Diagnosis not present

## 2021-05-31 DIAGNOSIS — E669 Obesity, unspecified: Secondary | ICD-10-CM | POA: Diagnosis not present

## 2021-06-07 DIAGNOSIS — S3092XD Unspecified superficial injury of abdominal wall, subsequent encounter: Secondary | ICD-10-CM | POA: Diagnosis not present

## 2021-06-07 DIAGNOSIS — Z79631 Long term (current) use of antimetabolite agent: Secondary | ICD-10-CM | POA: Diagnosis not present

## 2021-06-07 DIAGNOSIS — L98492 Non-pressure chronic ulcer of skin of other sites with fat layer exposed: Secondary | ICD-10-CM | POA: Diagnosis not present

## 2021-06-10 DIAGNOSIS — L98492 Non-pressure chronic ulcer of skin of other sites with fat layer exposed: Secondary | ICD-10-CM | POA: Diagnosis not present

## 2021-06-13 DIAGNOSIS — L98492 Non-pressure chronic ulcer of skin of other sites with fat layer exposed: Secondary | ICD-10-CM | POA: Diagnosis not present

## 2021-06-21 DIAGNOSIS — L98492 Non-pressure chronic ulcer of skin of other sites with fat layer exposed: Secondary | ICD-10-CM | POA: Diagnosis not present

## 2021-06-21 DIAGNOSIS — Y839 Surgical procedure, unspecified as the cause of abnormal reaction of the patient, or of later complication, without mention of misadventure at the time of the procedure: Secondary | ICD-10-CM | POA: Diagnosis not present

## 2021-06-21 DIAGNOSIS — S31109D Unspecified open wound of abdominal wall, unspecified quadrant without penetration into peritoneal cavity, subsequent encounter: Secondary | ICD-10-CM | POA: Diagnosis not present

## 2021-06-27 DIAGNOSIS — L98499 Non-pressure chronic ulcer of skin of other sites with unspecified severity: Secondary | ICD-10-CM | POA: Diagnosis not present

## 2021-06-28 DIAGNOSIS — L98492 Non-pressure chronic ulcer of skin of other sites with fat layer exposed: Secondary | ICD-10-CM | POA: Diagnosis not present

## 2021-07-05 DIAGNOSIS — Z9889 Other specified postprocedural states: Secondary | ICD-10-CM | POA: Diagnosis not present

## 2021-07-05 DIAGNOSIS — L98492 Non-pressure chronic ulcer of skin of other sites with fat layer exposed: Secondary | ICD-10-CM | POA: Diagnosis not present

## 2021-07-11 DIAGNOSIS — L98492 Non-pressure chronic ulcer of skin of other sites with fat layer exposed: Secondary | ICD-10-CM | POA: Diagnosis not present

## 2021-08-02 DIAGNOSIS — L905 Scar conditions and fibrosis of skin: Secondary | ICD-10-CM | POA: Diagnosis not present

## 2021-08-02 DIAGNOSIS — Z79899 Other long term (current) drug therapy: Secondary | ICD-10-CM | POA: Diagnosis not present

## 2021-08-16 DIAGNOSIS — Z79631 Long term (current) use of antimetabolite agent: Secondary | ICD-10-CM | POA: Diagnosis not present

## 2021-08-16 DIAGNOSIS — Z5181 Encounter for therapeutic drug level monitoring: Secondary | ICD-10-CM | POA: Diagnosis not present

## 2021-08-20 DIAGNOSIS — Z5181 Encounter for therapeutic drug level monitoring: Secondary | ICD-10-CM | POA: Diagnosis not present

## 2021-08-20 DIAGNOSIS — L304 Erythema intertrigo: Secondary | ICD-10-CM | POA: Diagnosis not present

## 2021-08-20 DIAGNOSIS — L98499 Non-pressure chronic ulcer of skin of other sites with unspecified severity: Secondary | ICD-10-CM | POA: Diagnosis not present

## 2021-09-05 ENCOUNTER — Other Ambulatory Visit (HOSPITAL_COMMUNITY): Payer: Self-pay | Admitting: Family Medicine

## 2021-09-05 ENCOUNTER — Ambulatory Visit (HOSPITAL_COMMUNITY)
Admission: RE | Admit: 2021-09-05 | Discharge: 2021-09-05 | Disposition: A | Discharge status: Home / Self Care | Payer: BC Managed Care – PPO | Source: Ambulatory Visit | Attending: Family Medicine | Admitting: Family Medicine

## 2021-09-05 DIAGNOSIS — M25512 Pain in left shoulder: Secondary | ICD-10-CM

## 2021-09-11 DIAGNOSIS — M25512 Pain in left shoulder: Secondary | ICD-10-CM | POA: Diagnosis not present

## 2021-09-18 DIAGNOSIS — M25512 Pain in left shoulder: Secondary | ICD-10-CM | POA: Diagnosis not present

## 2021-09-23 DIAGNOSIS — M25312 Other instability, left shoulder: Secondary | ICD-10-CM | POA: Diagnosis not present

## 2021-09-26 DIAGNOSIS — M25312 Other instability, left shoulder: Secondary | ICD-10-CM | POA: Diagnosis not present

## 2021-09-26 DIAGNOSIS — M6281 Muscle weakness (generalized): Secondary | ICD-10-CM | POA: Diagnosis not present

## 2021-09-26 DIAGNOSIS — M25512 Pain in left shoulder: Secondary | ICD-10-CM | POA: Diagnosis not present

## 2021-10-02 DIAGNOSIS — M25512 Pain in left shoulder: Secondary | ICD-10-CM | POA: Diagnosis not present

## 2021-10-02 DIAGNOSIS — M6281 Muscle weakness (generalized): Secondary | ICD-10-CM | POA: Diagnosis not present

## 2021-10-02 DIAGNOSIS — M25312 Other instability, left shoulder: Secondary | ICD-10-CM | POA: Diagnosis not present

## 2021-10-04 DIAGNOSIS — M6281 Muscle weakness (generalized): Secondary | ICD-10-CM | POA: Diagnosis not present

## 2021-10-04 DIAGNOSIS — M25512 Pain in left shoulder: Secondary | ICD-10-CM | POA: Diagnosis not present

## 2021-10-04 DIAGNOSIS — M25312 Other instability, left shoulder: Secondary | ICD-10-CM | POA: Diagnosis not present

## 2021-10-09 DIAGNOSIS — M25512 Pain in left shoulder: Secondary | ICD-10-CM | POA: Diagnosis not present

## 2021-10-09 DIAGNOSIS — M6281 Muscle weakness (generalized): Secondary | ICD-10-CM | POA: Diagnosis not present

## 2021-10-09 DIAGNOSIS — M25312 Other instability, left shoulder: Secondary | ICD-10-CM | POA: Diagnosis not present

## 2021-10-10 DIAGNOSIS — R7301 Impaired fasting glucose: Secondary | ICD-10-CM | POA: Diagnosis not present

## 2021-10-10 DIAGNOSIS — Z136 Encounter for screening for cardiovascular disorders: Secondary | ICD-10-CM | POA: Diagnosis not present

## 2021-10-10 DIAGNOSIS — Z0000 Encounter for general adult medical examination without abnormal findings: Secondary | ICD-10-CM | POA: Diagnosis not present

## 2021-10-11 DIAGNOSIS — M25512 Pain in left shoulder: Secondary | ICD-10-CM | POA: Diagnosis not present

## 2021-10-11 DIAGNOSIS — M25312 Other instability, left shoulder: Secondary | ICD-10-CM | POA: Diagnosis not present

## 2021-10-11 DIAGNOSIS — M6281 Muscle weakness (generalized): Secondary | ICD-10-CM | POA: Diagnosis not present

## 2021-10-12 DIAGNOSIS — Z0001 Encounter for general adult medical examination with abnormal findings: Secondary | ICD-10-CM | POA: Diagnosis not present

## 2021-10-16 DIAGNOSIS — M25512 Pain in left shoulder: Secondary | ICD-10-CM | POA: Diagnosis not present

## 2021-10-16 DIAGNOSIS — M25312 Other instability, left shoulder: Secondary | ICD-10-CM | POA: Diagnosis not present

## 2021-10-16 DIAGNOSIS — M6281 Muscle weakness (generalized): Secondary | ICD-10-CM | POA: Diagnosis not present

## 2021-10-30 DIAGNOSIS — M6281 Muscle weakness (generalized): Secondary | ICD-10-CM | POA: Diagnosis not present

## 2021-10-30 DIAGNOSIS — M25512 Pain in left shoulder: Secondary | ICD-10-CM | POA: Diagnosis not present

## 2021-10-30 DIAGNOSIS — M25312 Other instability, left shoulder: Secondary | ICD-10-CM | POA: Diagnosis not present

## 2021-11-01 DIAGNOSIS — M25312 Other instability, left shoulder: Secondary | ICD-10-CM | POA: Diagnosis not present

## 2021-11-01 DIAGNOSIS — M6281 Muscle weakness (generalized): Secondary | ICD-10-CM | POA: Diagnosis not present

## 2021-11-01 DIAGNOSIS — M25512 Pain in left shoulder: Secondary | ICD-10-CM | POA: Diagnosis not present

## 2021-11-02 ENCOUNTER — Encounter: Payer: Self-pay | Admitting: Emergency Medicine

## 2021-11-02 ENCOUNTER — Ambulatory Visit
Admission: EM | Admit: 2021-11-02 | Discharge: 2021-11-02 | Disposition: A | Discharge status: Home / Self Care | Payer: BC Managed Care – PPO | Attending: Family Medicine | Admitting: Family Medicine

## 2021-11-02 ENCOUNTER — Other Ambulatory Visit: Payer: Self-pay

## 2021-11-02 DIAGNOSIS — J069 Acute upper respiratory infection, unspecified: Secondary | ICD-10-CM | POA: Diagnosis not present

## 2021-11-02 MED ORDER — DOXYCYCLINE HYCLATE 100 MG PO CAPS: 100.0000 mg | ORAL_CAPSULE | Freq: Two times a day (BID) | ORAL | 0 refills | Status: DC

## 2021-11-02 MED ORDER — PROMETHAZINE-DM 6.25-15 MG/5ML PO SYRP: 5.0000 mL | ORAL_SOLUTION | Freq: Four times a day (QID) | ORAL | 0 refills | Status: DC | PRN

## 2021-11-02 MED ORDER — ALBUTEROL SULFATE HFA 108 (90 BASE) MCG/ACT IN AERS: 1.0000 | INHALATION_SPRAY | Freq: Four times a day (QID) | RESPIRATORY_TRACT | 0 refills | Status: AC | PRN

## 2021-11-02 NOTE — ED Triage Notes (Signed)
Pt reports productive cough and nasal congestion x5 days. Pt reports is currently out of inhaler and reports is taking methotrexate.   Reports family has similar symptoms and was seen this week and told had sinus infections. Pt reports history of similar.

## 2021-11-02 NOTE — Discharge Instructions (Signed)
Follow-up with your primary care provider early next week, particularly if you are not feeling better

## 2021-11-02 NOTE — ED Provider Notes (Signed)
[facility]    CSN: [id number] Arrival date & time: 11/02/21  0845      History   Chief Complaint Chief Complaint  Patient presents with   Cough    HPI Morgan Franklin is a 26 y.o. female.   Presenting today with about a week of progressively worsening productive cough, chest tightness, nasal congestion, facial pain and pressure.  Denies fever, chills, body aches, chest pain, shortness of breath, nausea vomiting or diarrhea.  Multiple sick contacts recently unsure with what.  Has been taking allergy medications, cold and congestion medications, over-the-counter relievers with no relief.  Of note, patient states she is currently on methotrexate.    Past Medical History:  Diagnosis Date   Androgen excess 10/14/2012   Has dark hair on arms, increase hair on back and belly and chin   Contraceptive management 06/27/2014   Irregular periods 10/14/2012   Will try OCs   LGSIL on Pap smear of cervix 11/02/2019   Possible higher grade lesion, will get colpo.   Menstrual extraction 01/14/2013   Periods more regular on the pill   Seasonal allergies     Patient Active Problem List   Diagnosis Date Noted   White coat syndrome with high blood pressure but without hypertension 02/01/2021   Elevated BP without diagnosis of hypertension 02/01/2021   Intractable menstrual migraine without status migrainosus 08/17/2020   Dysmenorrhea 08/17/2020   Menorrhagia with irregular cycle 08/17/2020   History of abnormal cervical Pap smear 08/17/2020   LGSIL on Pap smear of cervix 11/02/2019   Irregular intermenstrual bleeding 09/26/2019   Menstrual cramps 09/26/2019   Abdominal cramping 07/10/2016   Encounter for surveillance of contraceptive pills 07/10/2016   Irregular periods 10/14/2012   Androgen excess 10/14/2012    Past Surgical History:  Procedure Laterality Date   ABDOMINAL EXPLORATION SURGERY Bilateral 11/25/2019   GASTROSCHISIS CLOSURE     39 days old    OB History      Gravida  0   Para  0   Term  0   Preterm  0   AB  0   Living  0      SAB  0   IAB  0   Ectopic  0   Multiple  0   Live Births               Home Medications    Prior to Admission medications   Medication Sig Start Date End Date Taking? Authorizing Provider  albuterol (VENTOLIN HFA) 108 (90 Base) MCG/ACT inhaler Inhale 1-2 puffs into the lungs every 6 (six) hours as needed for wheezing or shortness of breath. 11/02/21  Yes Particia Nearing, PA-C  doxycycline (VIBRAMYCIN) 100 MG capsule Take 1 capsule (100 mg total) by mouth 2 (two) times daily. 11/02/21  Yes Particia Nearing, PA-C  promethazine-dextromethorphan (PROMETHAZINE-DM) 6.25-15 MG/5ML syrup Take 5 mLs by mouth 4 (four) times daily as needed. 11/02/21  Yes Particia Nearing, PA-C  clindamycin (CLINDAGEL) 1 % gel Apply topically 2 (two) times daily. 12/06/20   [provider]  clobetasol ointment (TEMOVATE) 0.05 % Apply topically 2 (two) times daily. 12/25/20   [provider]  Levonorgestrel-Ethinyl Estradiol (AMETHIA) 0.15-0.03 &0.01 MG tablet Take 1 tablet by mouth daily. 08/17/20   Adline Potter, NP    Family History Family History  Problem Relation Age of Onset   Hypertension Mother    Stroke Mother    Diabetes Father    Diabetes Paternal  Grandmother    Diabetes Maternal Grandfather     Social History Social History   Tobacco Use   Smoking status: Never   Smokeless tobacco: Never  Vaping Use   Vaping Use: Never used  Substance Use Topics   Alcohol use: Yes    Comment: very rarely   Drug use: No     Allergies   Patient has no known allergies.   Review of Systems Review of Systems Per HPI  Physical Exam Triage Vital Signs ED Triage Vitals [11/02/21 0928]  Enc Vitals Group     BP (!) 164/96     Pulse Rate (!) 108     Resp 20     Temp 99.2 F (37.3 C)     Temp Source Oral     SpO2 99 %     Weight      Height      Head Circumference       Peak Flow      Pain Score 4     Pain Loc      Pain Edu?      Excl. in GC?    No data found.  Updated Vital Signs BP (!) 164/96 (BP Location: Right Arm)   Pulse (!) 108   Temp 99.2 F (37.3 C) (Oral)   Resp 20   SpO2 99%   Visual Acuity Right Eye Distance:   Left Eye Distance:   Bilateral Distance:    Right Eye Near:   Left Eye Near:    Bilateral Near:     Physical Exam Vitals and nursing note reviewed.  Constitutional:      Appearance: Normal appearance.  HENT:     Head: Atraumatic.     Right Ear: Tympanic membrane and external ear normal.     Left Ear: Tympanic membrane and external ear normal.     Nose: Congestion present.     Mouth/Throat:     Mouth: Mucous membranes are moist.     Pharynx: Posterior oropharyngeal erythema present.  Eyes:     Extraocular Movements: Extraocular movements intact.     Conjunctiva/sclera: Conjunctivae normal.  Cardiovascular:     Rate and Rhythm: Normal rate and regular rhythm.     Heart sounds: Normal heart sounds.  Pulmonary:     Effort: Pulmonary effort is normal.     Breath sounds: Normal breath sounds. No wheezing or rales.  Musculoskeletal:        General: Normal range of motion.     Cervical back: Normal range of motion and neck supple.  Skin:    General: Skin is warm and dry.  Neurological:     Mental Status: She is alert and oriented to person, place, and time.  Psychiatric:        Mood and Affect: Mood normal.        Thought Content: Thought content normal.      UC Treatments / Results  Labs (all labs ordered are listed, but only abnormal results are displayed) Labs Reviewed - No data to display  EKG   Radiology No results found.  Procedures Procedures (including critical care time)  Medications Ordered in UC Medications - No data to display  Initial Impression / Assessment and Plan / UC Course  I have reviewed the triage vital signs and the nursing notes.  Pertinent labs & imaging results  that were available during my care of the patient were reviewed by me and considered in my medical decision making (see chart  for details).     Do suspect initially viral infection now progressing into a bacterial sinusitis.  Start doxycycline cautiously given concurrent use of methotrexate, allergy regimen, cold and congestion medications and Phenergan DM.  Supportive care, return precautions reviewed.  Follow-up closely with prescriber for methotrexate  Final Clinical Impressions(s) / UC Diagnoses   Final diagnoses:  Upper respiratory tract infection, unspecified type     Discharge Instructions      Follow-up with your primary care provider early next week, particularly if you are not feeling better    ED Prescriptions     Medication Sig Dispense Auth. Provider   doxycycline (VIBRAMYCIN) 100 MG capsule Take 1 capsule (100 mg total) by mouth 2 (two) times daily. 14 capsule Particia Nearing, [place]   promethazine-dextromethorphan (PROMETHAZINE-DM) 6.25-15 MG/5ML syrup Take 5 mLs by mouth 4 (four) times daily as needed. 100 mL Particia Nearing, PA-C   albuterol (VENTOLIN HFA) 108 (90 Base) MCG/ACT inhaler Inhale 1-2 puffs into the lungs every 6 (six) hours as needed for wheezing or shortness of breath. 18 g Particia Nearing, [place]      PDMP not reviewed this encounter.   Particia Nearing, [place] 11/02/21 1010

## 2021-11-08 DIAGNOSIS — L98499 Non-pressure chronic ulcer of skin of other sites with unspecified severity: Secondary | ICD-10-CM | POA: Diagnosis not present

## 2021-11-08 DIAGNOSIS — Z5181 Encounter for therapeutic drug level monitoring: Secondary | ICD-10-CM | POA: Diagnosis not present

## 2021-11-11 ENCOUNTER — Other Ambulatory Visit: Payer: Self-pay | Admitting: Adult Health

## 2021-11-11 DIAGNOSIS — M25312 Other instability, left shoulder: Secondary | ICD-10-CM | POA: Diagnosis not present

## 2021-11-15 DIAGNOSIS — L88 Pyoderma gangrenosum: Secondary | ICD-10-CM | POA: Diagnosis not present

## 2021-11-15 DIAGNOSIS — Z5181 Encounter for therapeutic drug level monitoring: Secondary | ICD-10-CM | POA: Diagnosis not present

## 2021-11-22 DIAGNOSIS — E782 Mixed hyperlipidemia: Secondary | ICD-10-CM | POA: Diagnosis not present

## 2021-11-28 DIAGNOSIS — X58XXXA Exposure to other specified factors, initial encounter: Secondary | ICD-10-CM | POA: Diagnosis not present

## 2021-11-28 DIAGNOSIS — S43432A Superior glenoid labrum lesion of left shoulder, initial encounter: Secondary | ICD-10-CM | POA: Diagnosis not present

## 2021-11-28 DIAGNOSIS — S43492A Other sprain of left shoulder joint, initial encounter: Secondary | ICD-10-CM | POA: Diagnosis not present

## 2021-11-28 DIAGNOSIS — Y999 Unspecified external cause status: Secondary | ICD-10-CM | POA: Diagnosis not present

## 2021-11-28 DIAGNOSIS — M25312 Other instability, left shoulder: Secondary | ICD-10-CM | POA: Diagnosis not present

## 2021-11-28 DIAGNOSIS — G8918 Other acute postprocedural pain: Secondary | ICD-10-CM | POA: Diagnosis not present

## 2022-01-10 DIAGNOSIS — E782 Mixed hyperlipidemia: Secondary | ICD-10-CM | POA: Diagnosis not present

## 2022-01-10 DIAGNOSIS — E1165 Type 2 diabetes mellitus with hyperglycemia: Secondary | ICD-10-CM | POA: Diagnosis not present

## 2022-01-10 DIAGNOSIS — M25512 Pain in left shoulder: Secondary | ICD-10-CM | POA: Diagnosis not present

## 2022-01-10 DIAGNOSIS — M6281 Muscle weakness (generalized): Secondary | ICD-10-CM | POA: Diagnosis not present

## 2022-01-10 DIAGNOSIS — Z4789 Encounter for other orthopedic aftercare: Secondary | ICD-10-CM | POA: Diagnosis not present

## 2022-01-13 DIAGNOSIS — E782 Mixed hyperlipidemia: Secondary | ICD-10-CM | POA: Diagnosis not present

## 2022-01-13 DIAGNOSIS — M359 Systemic involvement of connective tissue, unspecified: Secondary | ICD-10-CM | POA: Diagnosis not present

## 2022-01-13 DIAGNOSIS — Q433 Congenital malformations of intestinal fixation: Secondary | ICD-10-CM | POA: Diagnosis not present

## 2022-01-13 DIAGNOSIS — E1165 Type 2 diabetes mellitus with hyperglycemia: Secondary | ICD-10-CM | POA: Diagnosis not present

## 2022-01-17 DIAGNOSIS — Z4789 Encounter for other orthopedic aftercare: Secondary | ICD-10-CM | POA: Diagnosis not present

## 2022-01-17 DIAGNOSIS — M6281 Muscle weakness (generalized): Secondary | ICD-10-CM | POA: Diagnosis not present

## 2022-01-17 DIAGNOSIS — M25512 Pain in left shoulder: Secondary | ICD-10-CM | POA: Diagnosis not present

## 2022-01-22 DIAGNOSIS — M25512 Pain in left shoulder: Secondary | ICD-10-CM | POA: Diagnosis not present

## 2022-01-22 DIAGNOSIS — Z4789 Encounter for other orthopedic aftercare: Secondary | ICD-10-CM | POA: Diagnosis not present

## 2022-01-22 DIAGNOSIS — M6281 Muscle weakness (generalized): Secondary | ICD-10-CM | POA: Diagnosis not present

## 2022-01-24 DIAGNOSIS — Z4789 Encounter for other orthopedic aftercare: Secondary | ICD-10-CM | POA: Diagnosis not present

## 2022-01-24 DIAGNOSIS — M6281 Muscle weakness (generalized): Secondary | ICD-10-CM | POA: Diagnosis not present

## 2022-01-24 DIAGNOSIS — M25512 Pain in left shoulder: Secondary | ICD-10-CM | POA: Diagnosis not present

## 2022-01-29 DIAGNOSIS — Z4789 Encounter for other orthopedic aftercare: Secondary | ICD-10-CM | POA: Diagnosis not present

## 2022-01-29 DIAGNOSIS — M25512 Pain in left shoulder: Secondary | ICD-10-CM | POA: Diagnosis not present

## 2022-01-29 DIAGNOSIS — M6281 Muscle weakness (generalized): Secondary | ICD-10-CM | POA: Diagnosis not present

## 2022-01-31 DIAGNOSIS — M25512 Pain in left shoulder: Secondary | ICD-10-CM | POA: Diagnosis not present

## 2022-01-31 DIAGNOSIS — Z4789 Encounter for other orthopedic aftercare: Secondary | ICD-10-CM | POA: Diagnosis not present

## 2022-01-31 DIAGNOSIS — M6281 Muscle weakness (generalized): Secondary | ICD-10-CM | POA: Diagnosis not present

## 2022-02-05 DIAGNOSIS — Z4789 Encounter for other orthopedic aftercare: Secondary | ICD-10-CM | POA: Diagnosis not present

## 2022-02-05 DIAGNOSIS — M25512 Pain in left shoulder: Secondary | ICD-10-CM | POA: Diagnosis not present

## 2022-02-05 DIAGNOSIS — M6281 Muscle weakness (generalized): Secondary | ICD-10-CM | POA: Diagnosis not present

## 2022-02-07 DIAGNOSIS — L0889 Other specified local infections of the skin and subcutaneous tissue: Secondary | ICD-10-CM | POA: Diagnosis not present

## 2022-02-07 DIAGNOSIS — M6281 Muscle weakness (generalized): Secondary | ICD-10-CM | POA: Diagnosis not present

## 2022-02-07 DIAGNOSIS — M25512 Pain in left shoulder: Secondary | ICD-10-CM | POA: Diagnosis not present

## 2022-02-07 DIAGNOSIS — Z4789 Encounter for other orthopedic aftercare: Secondary | ICD-10-CM | POA: Diagnosis not present

## 2022-02-07 DIAGNOSIS — T8131XA Disruption of external operation (surgical) wound, not elsewhere classified, initial encounter: Secondary | ICD-10-CM | POA: Diagnosis not present

## 2022-02-12 DIAGNOSIS — M25512 Pain in left shoulder: Secondary | ICD-10-CM | POA: Diagnosis not present

## 2022-02-12 DIAGNOSIS — M6281 Muscle weakness (generalized): Secondary | ICD-10-CM | POA: Diagnosis not present

## 2022-02-12 DIAGNOSIS — Z4789 Encounter for other orthopedic aftercare: Secondary | ICD-10-CM | POA: Diagnosis not present

## 2022-02-13 DIAGNOSIS — L98492 Non-pressure chronic ulcer of skin of other sites with fat layer exposed: Secondary | ICD-10-CM | POA: Diagnosis not present

## 2022-02-14 ENCOUNTER — Encounter: Payer: Self-pay | Admitting: Dietician

## 2022-02-14 ENCOUNTER — Encounter: Payer: BC Managed Care – PPO | Attending: Family Medicine | Admitting: Dietician

## 2022-02-14 VITALS — Ht 60.0 in | Wt 205.6 lb

## 2022-02-14 DIAGNOSIS — Z4789 Encounter for other orthopedic aftercare: Secondary | ICD-10-CM | POA: Diagnosis not present

## 2022-02-14 DIAGNOSIS — E119 Type 2 diabetes mellitus without complications: Secondary | ICD-10-CM | POA: Insufficient documentation

## 2022-02-14 DIAGNOSIS — M6281 Muscle weakness (generalized): Secondary | ICD-10-CM | POA: Diagnosis not present

## 2022-02-14 DIAGNOSIS — M25512 Pain in left shoulder: Secondary | ICD-10-CM | POA: Diagnosis not present

## 2022-02-14 NOTE — Progress Notes (Signed)
Diabetes Self-Management Education  Visit Type: First/Initial  Appt. Start Time: 0810 Appt. End Time: 0915  02/14/2022  Ms. Morgan Franklin, identified by name and date of birth, is a 26 y.o. female with a diagnosis of Diabetes: Type 2.   ASSESSMENT  Primary concern: Pt was diagnosed with diabetes in July with A1C of 7% and wanted to try to make lifestyle changes without medication to control it. Her A1c went up to 7.5% in October so she wanted to learn about further changes.   History includes: type 2 diabetes Labs noted: 01/10/2022: A1c 7.5%, chol 225, trig 154, LDL 153.  Medications include: reviewed Supplements: none  Patient lives with mom and dad. Pt's mom does shopping and cooking. Pt states she sometimes shops for more nutritious options for snacks.  Pt states she is having a difficult time because both parents have type 2 diabetes and she feels that they just take their medication but don't take care of themselves and feels that she is having to do this alone.   Pt works four 10 hour shifts. Starts at 7am, 15 min break at 9am, 11am and 3pm, and has an hour for lunch 1-2pm. Pt states 2 days a week she will go walk for 25 minutes on her lunch break but the other days she uses it as an opportunity to shower.   Pt reports that she lives near some walking trails and has a 13 year old cousin who she wants to take walking.   Pt with high intake of refined carbohydrate and sugar sweetened beverages.  Height 5' (1.524 m), weight 205 lb 9.6 oz (93.3 kg). Body mass index is 40.15 kg/m.   Diabetes Self-Management Education - 02/14/22 0812       Visit Information   Visit Type First/Initial      Initial Visit   Diabetes Type Type 2    Date Diagnosed 10/2021    Are you currently following a meal plan? No    Are you taking your medications as prescribed? Not on Medications      Health Coping   How would you rate your overall health? Fair      Psychosocial Assessment    Patient Belief/Attitude about Diabetes Motivated to manage diabetes    What is the hardest part about your diabetes right now, causing you the most concern, or is the most worrisome to you about your diabetes?   Making healty food and beverage choices    Self-care barriers None    Self-management support Doctor's office    Other persons present Patient    Patient Concerns Healthy Lifestyle    Special Needs None    Preferred Learning Style No preference indicated    Learning Readiness Ready    How often do you need to have someone help you when you read instructions, pamphlets, or other written materials from your doctor or pharmacy? 1 - Never    What is the last grade level you completed in school? 12th      Pre-Education Assessment   Patient understands the diabetes disease and treatment process. Needs Instruction    Patient understands incorporating nutritional management into lifestyle. Needs Instruction    Patient undertands incorporating physical activity into lifestyle. Needs Instruction    Patient understands using medications safely. Needs Instruction    Patient understands monitoring blood glucose, interpreting and using results Needs Instruction    Patient understands prevention, detection, and treatment of acute complications. Needs Instruction    Patient understands prevention,  detection, and treatment of chronic complications. Needs Instruction    Patient understands how to develop strategies to address psychosocial issues. Needs Instruction    Patient understands how to develop strategies to promote health/change behavior. Needs Instruction      Complications   Last HgB A1C per patient/outside source 7.5 %    How often do you check your blood sugar? Not recommended by provider    Have you had a dilated eye exam in the past 12 months? Yes    Have you had a dental exam in the past 12 months? No    Are you checking your feet? No      Dietary Intake   Breakfast 9am: vegetable  and chicken soup OR noodles    Snack (morning) 11am: poptart OR candy    Lunch 1pm: leftovers OR microwave meal    Snack (afternoon) 3pm: poptart OR candy    Dinner 7pm: vegetable chicken soup OR ramen noodles OR rice with chicken    Snack (evening) none OR more dinner    Beverage(s) water, lipton sweetened green tea, propel (SF), powerade, sweet tea      Activity / Exercise   Activity / Exercise Type Light (walking / raking leaves)    How many days per week do you exercise? 2    How many minutes per day do you exercise? 25    Total minutes per week of exercise 50      Patient Education   Previous Diabetes Education No    Disease Pathophysiology Definition of diabetes, type 1 and 2, and the diagnosis of diabetes;Factors that contribute to the development of diabetes;Explored patient's options for treatment of their diabetes    Healthy Eating Role of diet in the treatment of diabetes and the relationship between the three main macronutrients and blood glucose level;Food label reading, portion sizes and measuring food.;Plate Method;Carbohydrate counting;Reviewed blood glucose goals for pre and post meals and how to evaluate the patients' food intake on their blood glucose level.;Meal timing in regards to the patients' current diabetes medication.;Effects of alcohol on blood glucose and safety factors with consumption of alcohol.;Information on hints to eating out and maintain blood glucose control.;Meal options for control of blood glucose level and chronic complications.    Being Active Role of exercise on diabetes management, blood pressure control and cardiac health.;Helped patient identify appropriate exercises in relation to his/her diabetes, diabetes complications and other health issue.    Monitoring Identified appropriate SMBG and/or A1C goals.;Daily foot exams;Yearly dilated eye exam    Acute complications Taught prevention, symptoms, and  treatment of hypoglycemia - the 15 rule.;Discussed  and identified patients' prevention, symptoms, and treatment of hyperglycemia.    Chronic complications Relationship between chronic complications and blood glucose control;Assessed and discussed foot care and prevention of foot problems;Lipid levels, blood glucose control and heart disease;Identified and discussed with patient  current chronic complications;Dental care;Retinopathy and reason for yearly dilated eye exams;Nephropathy, what it is, prevention of, the use of ACE, ARB's and early detection of through urine microalbumia.;Reviewed with patient heart disease, higher risk of, and prevention    Diabetes Stress and Support Identified and addressed patients feelings and concerns about diabetes;Worked with patient to identify barriers to care and solutions;Role of stress on diabetes    Lifestyle and Health Coping Lifestyle issues that need to be addressed for better diabetes care      Individualized Goals (developed by patient)   Nutrition General guidelines for healthy choices and portions discussed  Physical Activity Exercise 3-5 times per week;30 minutes per day    Medications Not Applicable    Monitoring  Test my blood glucose as discussed    Problem Solving Eating Pattern    Reducing Risk examine blood glucose patterns;treat hypoglycemia with 15 grams of carbs if blood glucose less than 70mg /dL;do foot checks daily    Health Coping Ask for help with psychological, social, or emotional issues      Post-Education Assessment   Patient understands the diabetes disease and treatment process. Comprehends key points    Patient understands incorporating nutritional management into lifestyle. Comprehends key points    Patient undertands incorporating physical activity into lifestyle. Comprehends key points    Patient understands using medications safely. N/A    Patient understands monitoring blood glucose, interpreting and using results Comprehends key points    Patient understands prevention,  detection, and treatment of acute complications. Comprehends key points    Patient understands prevention, detection, and treatment of chronic complications. Comprehends key points    Patient understands how to develop strategies to address psychosocial issues. Comprehends key points    Patient understands how to develop strategies to promote health/change behavior. Comprehends key points      Outcomes   Expected Outcomes Demonstrated interest in learning. Expect positive outcomes    Future DMSE 3-4 months    Program Status Not Completed             Individualized Plan for Diabetes Self-Management Training:   Learning Objective:  Patient will have a greater understanding of diabetes self-management. Patient education plan is to attend individual and/or group sessions per assessed needs and concerns.   Plan:   Patient Instructions  Aim for 150 minutes of physical activity weekly. -Continue walking 2 days on your lunch break -Consider going to a park on your day off to walk or walk in your neighborhood [place]: country park, [place] park, [employer], [place], [place], [place], [place].  Eat more Non-Starchy Vegetables. Aim to make 1/2 of your plate non-starchy vegetables at least twice a day.   Minimize added sugars and refined grains. Rethink what you drink. Choose beverages without added sugar. Look for 0 carbs on the label. -limit sweet tea, powerade, and sweetened green tea  Choose whole foods over processed.  Make simple meals at home more often than eating out.  Aim to eat within 1-2 hours of waking up and every 3-5 hours following.    Expected Outcomes:  Demonstrated interest in learning. Expect positive outcomes  Education material provided: ADA - How to Thrive: A Guide for Your Journey with Diabetes, My Plate, and Snack sheet, Dining out the healthy way, Balanced Plate Food Groups   If problems or questions, patient to  contact team via:  Phone  Future DSME appointment: 3-4 months

## 2022-02-14 NOTE — Patient Instructions (Addendum)
Aim for 150 minutes of physical activity weekly. -Continue walking 2 days on your lunch break -Consider going to a park on your day off to walk or walk in your neighborhood [place]: country park, [employer] park, [place], [address], [place], [place].  Eat more Non-Starchy Vegetables. Aim to make 1/2 of your plate non-starchy vegetables at least twice a day.   Minimize added sugars and refined grains. Rethink what you drink. Choose beverages without added sugar. Look for 0 carbs on the label. -limit sweet tea, powerade, and sweetened green tea  Choose whole foods over processed.  Make simple meals at home more often than eating out.  Aim to eat within 1-2 hours of waking up and every 3-5 hours following.

## 2022-02-21 DIAGNOSIS — L98492 Non-pressure chronic ulcer of skin of other sites with fat layer exposed: Secondary | ICD-10-CM | POA: Diagnosis not present

## 2022-02-21 DIAGNOSIS — E119 Type 2 diabetes mellitus without complications: Secondary | ICD-10-CM | POA: Diagnosis not present

## 2022-02-26 DIAGNOSIS — L98492 Non-pressure chronic ulcer of skin of other sites with fat layer exposed: Secondary | ICD-10-CM | POA: Diagnosis not present

## 2022-03-07 DIAGNOSIS — L98492 Non-pressure chronic ulcer of skin of other sites with fat layer exposed: Secondary | ICD-10-CM | POA: Diagnosis not present

## 2022-03-14 DIAGNOSIS — L98492 Non-pressure chronic ulcer of skin of other sites with fat layer exposed: Secondary | ICD-10-CM | POA: Diagnosis not present

## 2022-03-20 DIAGNOSIS — E119 Type 2 diabetes mellitus without complications: Secondary | ICD-10-CM | POA: Diagnosis not present

## 2022-03-20 DIAGNOSIS — L98492 Non-pressure chronic ulcer of skin of other sites with fat layer exposed: Secondary | ICD-10-CM | POA: Diagnosis not present

## 2022-03-20 DIAGNOSIS — L98499 Non-pressure chronic ulcer of skin of other sites with unspecified severity: Secondary | ICD-10-CM | POA: Diagnosis not present

## 2022-03-21 DIAGNOSIS — M25512 Pain in left shoulder: Secondary | ICD-10-CM | POA: Diagnosis not present

## 2022-03-25 DIAGNOSIS — L986 Other infiltrative disorders of the skin and subcutaneous tissue: Secondary | ICD-10-CM | POA: Diagnosis not present

## 2022-03-25 DIAGNOSIS — L309 Dermatitis, unspecified: Secondary | ICD-10-CM | POA: Diagnosis not present

## 2022-03-25 DIAGNOSIS — L905 Scar conditions and fibrosis of skin: Secondary | ICD-10-CM | POA: Diagnosis not present

## 2022-03-25 DIAGNOSIS — B372 Candidiasis of skin and nail: Secondary | ICD-10-CM | POA: Diagnosis not present

## 2022-04-04 DIAGNOSIS — L98492 Non-pressure chronic ulcer of skin of other sites with fat layer exposed: Secondary | ICD-10-CM | POA: Diagnosis not present

## 2022-04-11 DIAGNOSIS — L98492 Non-pressure chronic ulcer of skin of other sites with fat layer exposed: Secondary | ICD-10-CM | POA: Diagnosis not present

## 2022-04-25 DIAGNOSIS — L739 Follicular disorder, unspecified: Secondary | ICD-10-CM | POA: Diagnosis not present

## 2022-04-25 DIAGNOSIS — L304 Erythema intertrigo: Secondary | ICD-10-CM | POA: Diagnosis not present

## 2022-05-01 DIAGNOSIS — J069 Acute upper respiratory infection, unspecified: Secondary | ICD-10-CM | POA: Diagnosis not present

## 2022-05-01 DIAGNOSIS — Z6841 Body Mass Index (BMI) 40.0 and over, adult: Secondary | ICD-10-CM | POA: Diagnosis not present

## 2022-05-02 DIAGNOSIS — L98492 Non-pressure chronic ulcer of skin of other sites with fat layer exposed: Secondary | ICD-10-CM | POA: Diagnosis not present

## 2022-05-06 DIAGNOSIS — J069 Acute upper respiratory infection, unspecified: Secondary | ICD-10-CM | POA: Diagnosis not present

## 2022-05-06 DIAGNOSIS — R059 Cough, unspecified: Secondary | ICD-10-CM | POA: Diagnosis not present

## 2022-05-12 IMAGING — US US ABDOMEN LIMITED
1 series · 14 of 21 positions shown · non-contrast
Comparison: None

CLINICAL DATA: Abnormal granulation tissue, open wound in LEFT
upper quadrant at transverse abdominal scar, head neonatal surgery
for stomach repair 25 years ago, wound open 1 month ago, with pain
redness and swelling

EXAM:
ULTRASOUND ABDOMEN LIMITED

[Series 1: us abdomen limited · 14 of 21 slices shown]
[im 1/21]
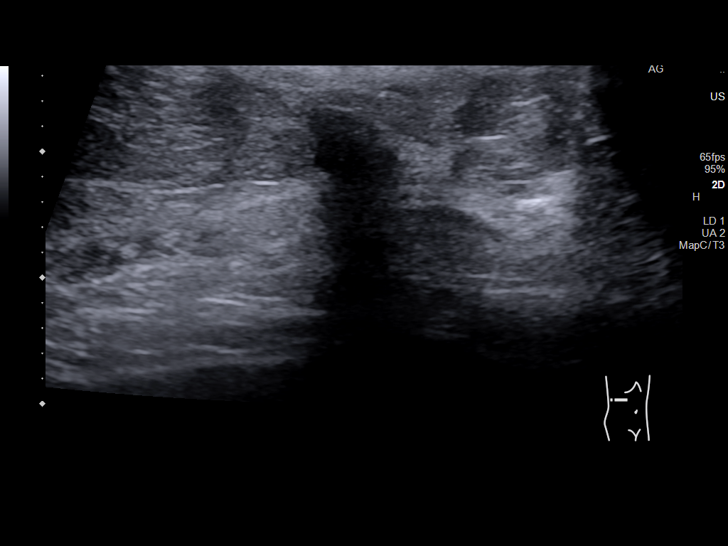
[im 3/21]
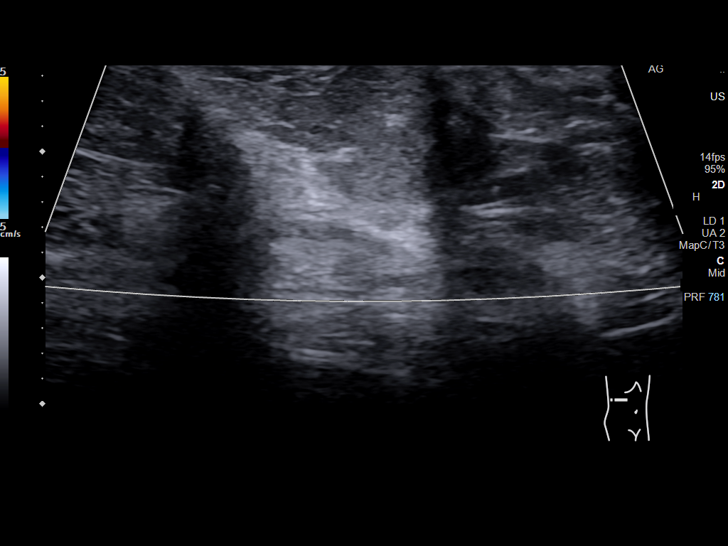
[im 4/21]
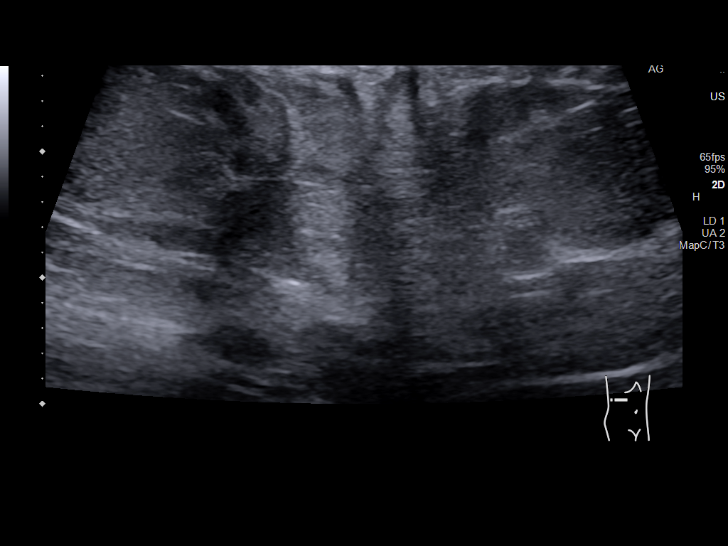
[im 6/21]
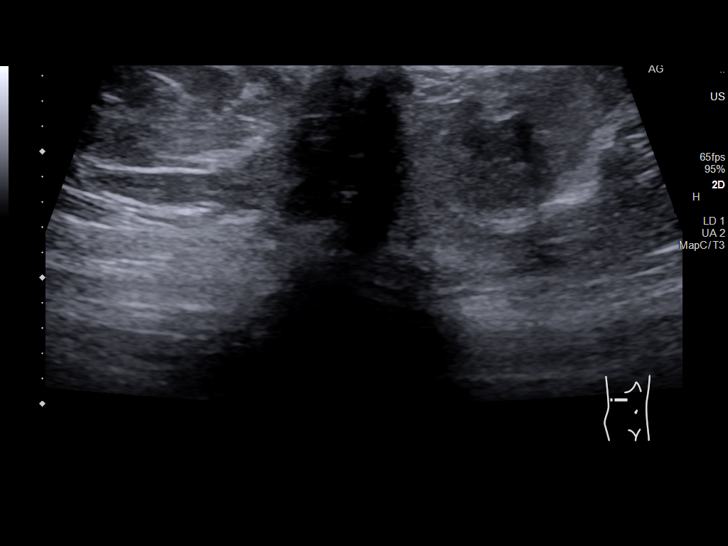
[im 7/21]
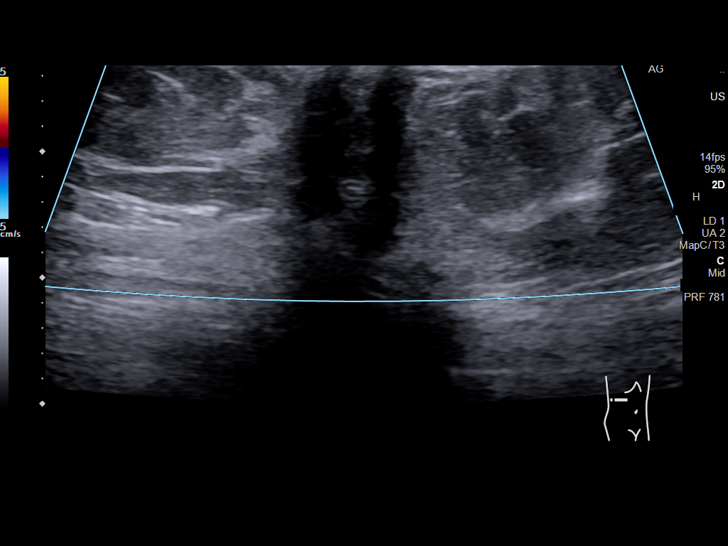
[im 9/21]
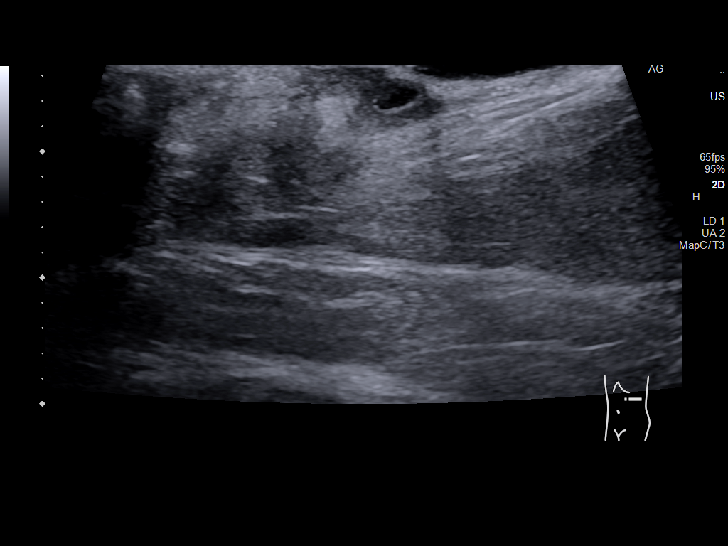
[im 10/21]
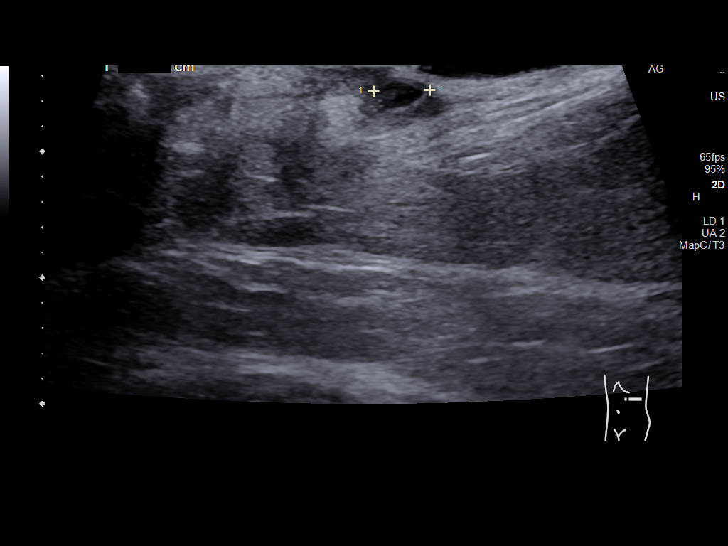
[im 12/21]
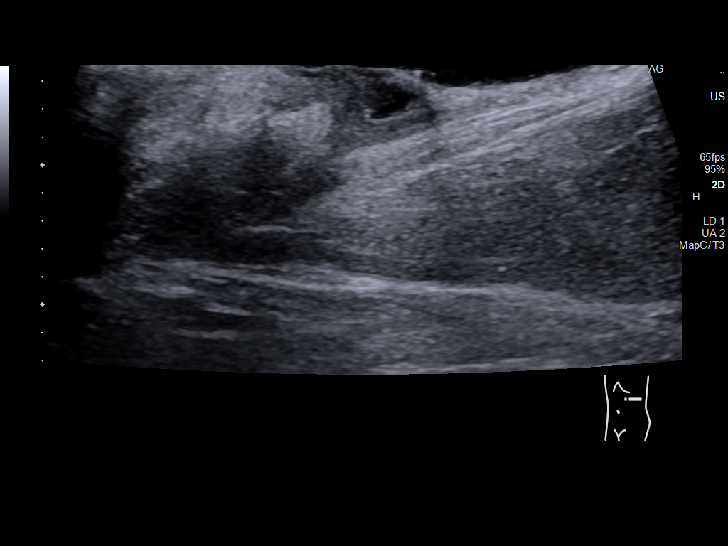
[im 13/21]
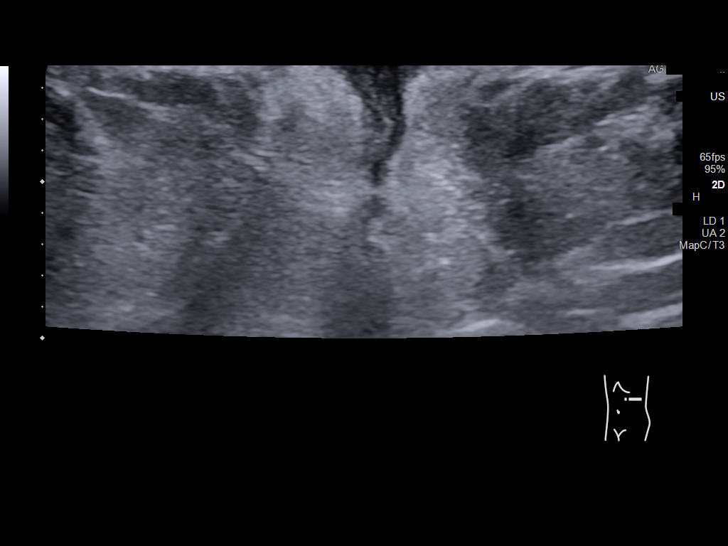
[im 15/21]
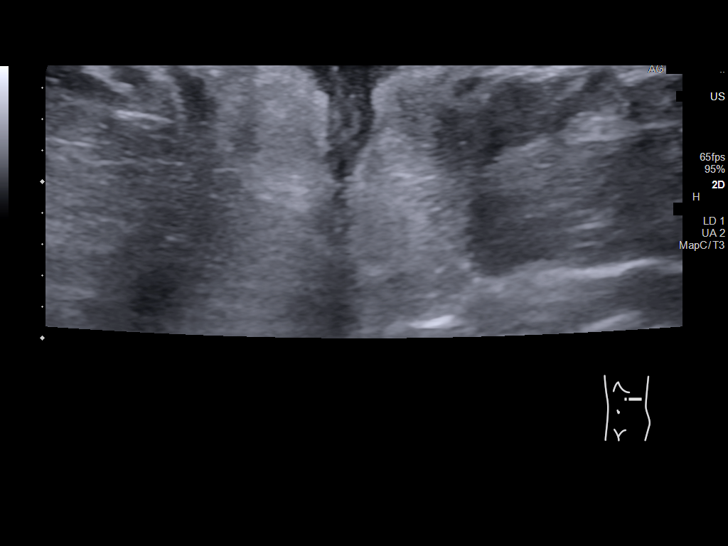
[im 16/21]
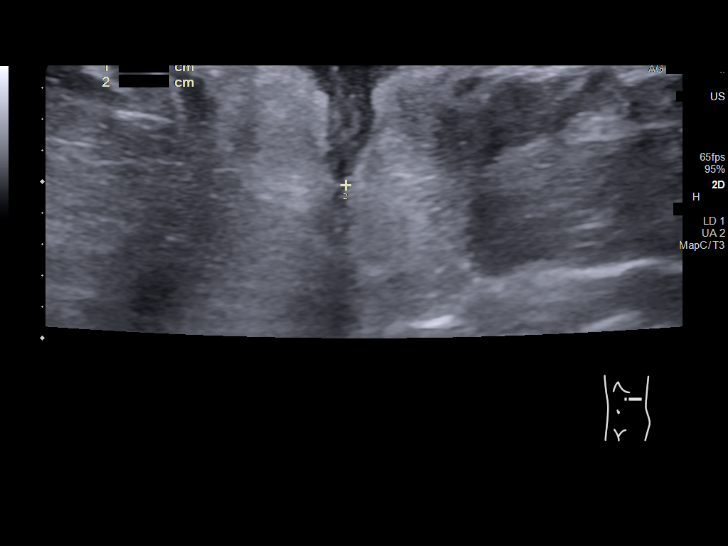
[im 18/21]
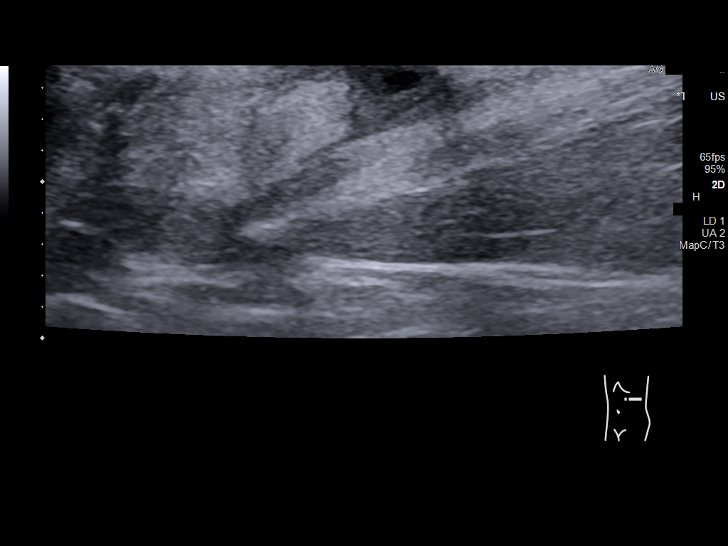
[im 19/21]
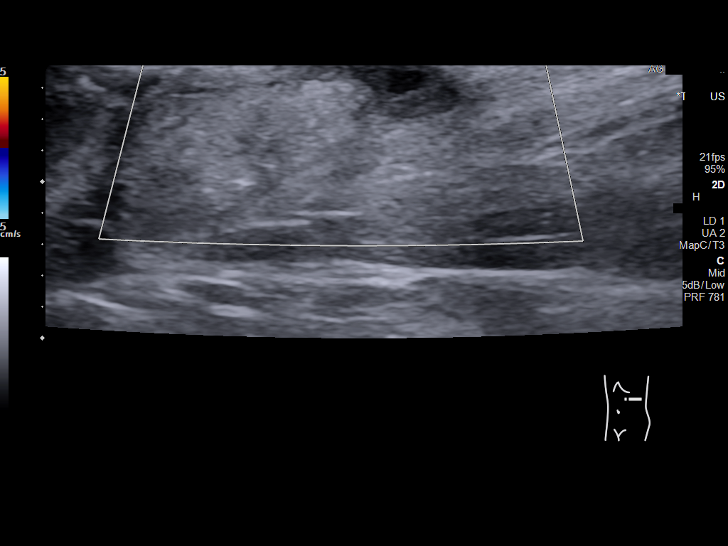
[im 21/21]
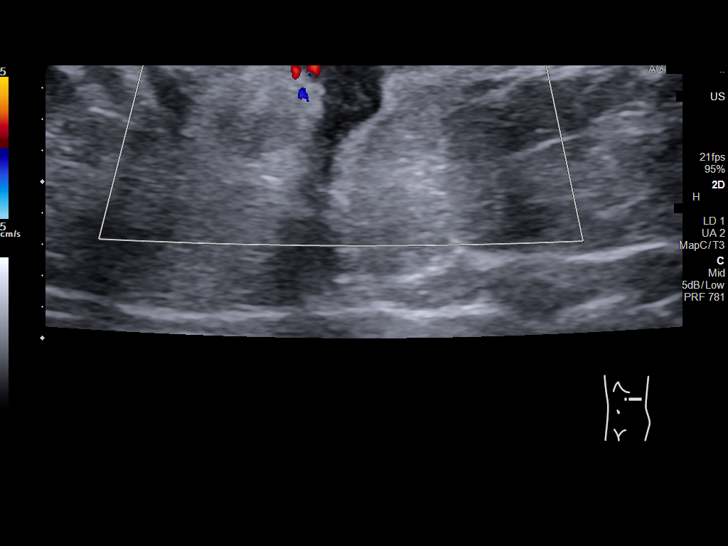

[14 of 21 positions shown; findings below may reference images not displayed]

FINDINGS: At the RIGHT lateral aspect of the surgical scar, no sonographic
abnormalities are identified; no fluid collection or mass. At the
LEFT lateral aspect of the surgical scar, a subcutaneous hypoechoic
collection is seen 9 x 10 x 5 mm, could represent a small abscess,
sinus tract, or fistulous track. This extends approximately 10 mm
deep. No additional fluid collections or mass.
IMPRESSION: 9 x 5 x 10 mm subcutaneous collection at site of wound in LEFT upper
quadrant, question small subcutaneous abscess or sinus/fistulous
tract.

## 2022-05-16 DIAGNOSIS — Z139 Encounter for screening, unspecified: Secondary | ICD-10-CM | POA: Diagnosis not present

## 2022-05-16 DIAGNOSIS — E1165 Type 2 diabetes mellitus with hyperglycemia: Secondary | ICD-10-CM | POA: Diagnosis not present

## 2022-05-16 DIAGNOSIS — E782 Mixed hyperlipidemia: Secondary | ICD-10-CM | POA: Diagnosis not present

## 2022-05-22 ENCOUNTER — Other Ambulatory Visit (HOSPITAL_COMMUNITY): Payer: Self-pay | Admitting: Family Medicine

## 2022-05-22 DIAGNOSIS — E1165 Type 2 diabetes mellitus with hyperglycemia: Secondary | ICD-10-CM | POA: Diagnosis not present

## 2022-05-22 DIAGNOSIS — Q433 Congenital malformations of intestinal fixation: Secondary | ICD-10-CM | POA: Diagnosis not present

## 2022-05-22 DIAGNOSIS — R7401 Elevation of levels of liver transaminase levels: Secondary | ICD-10-CM

## 2022-05-22 DIAGNOSIS — M359 Systemic involvement of connective tissue, unspecified: Secondary | ICD-10-CM | POA: Diagnosis not present

## 2022-05-22 DIAGNOSIS — E782 Mixed hyperlipidemia: Secondary | ICD-10-CM | POA: Diagnosis not present

## 2022-06-05 ENCOUNTER — Encounter: Payer: Self-pay | Admitting: Radiology

## 2022-06-06 ENCOUNTER — Ambulatory Visit (HOSPITAL_COMMUNITY): Payer: BC Managed Care – PPO

## 2022-06-06 ENCOUNTER — Ambulatory Visit: Payer: BC Managed Care – PPO | Admitting: Dietician

## 2022-06-06 ENCOUNTER — Encounter (HOSPITAL_COMMUNITY): Payer: Self-pay

## 2022-06-11 IMAGING — DX DG ANKLE COMPLETE 3+V*L*
3 series · 3 of 3 positions shown · non-contrast
Comparison: None.

CLINICAL DATA: Pain and swelling

EXAM:
LEFT ANKLE COMPLETE - 3+ VIEW

[ankle ap]
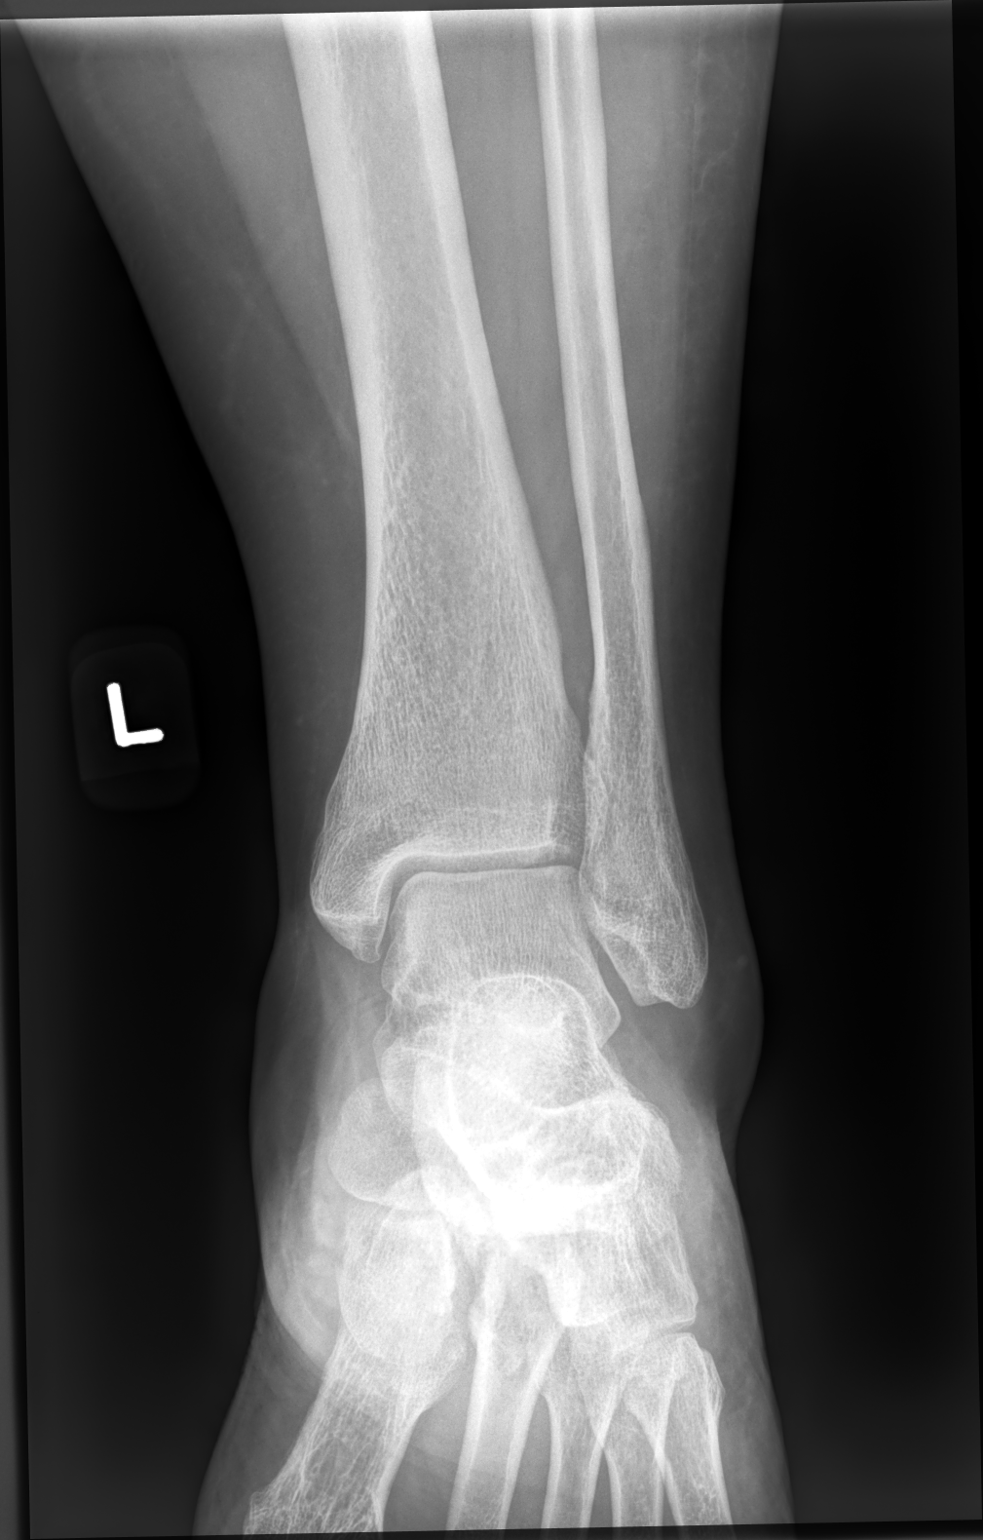

[ankle mlo]
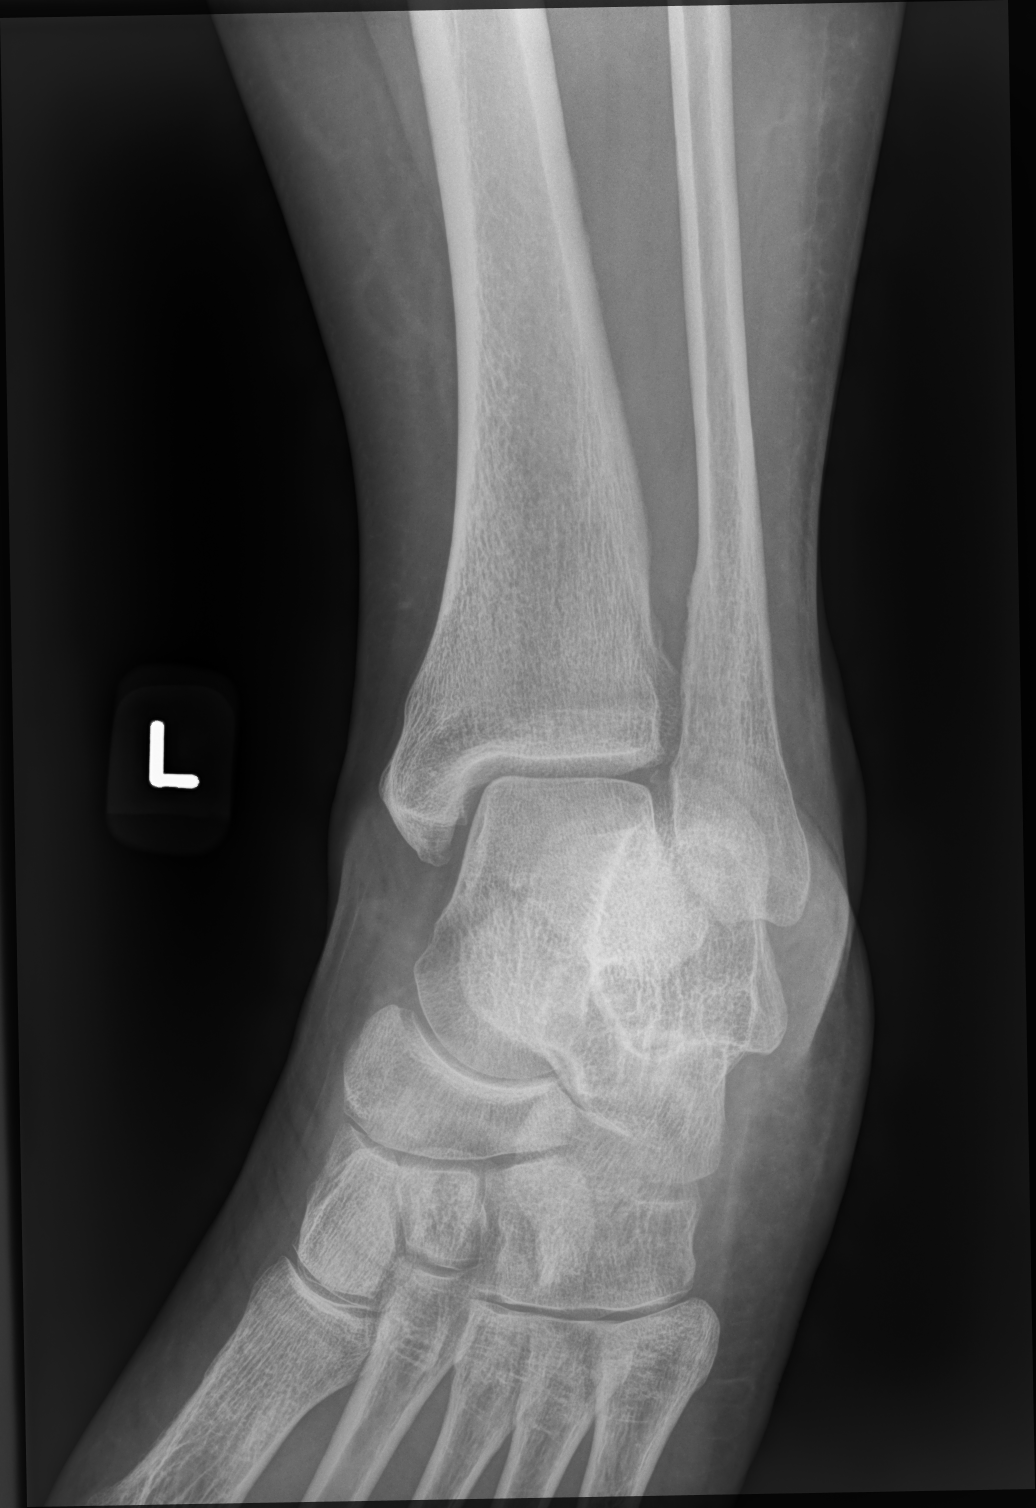

[ankle lat]
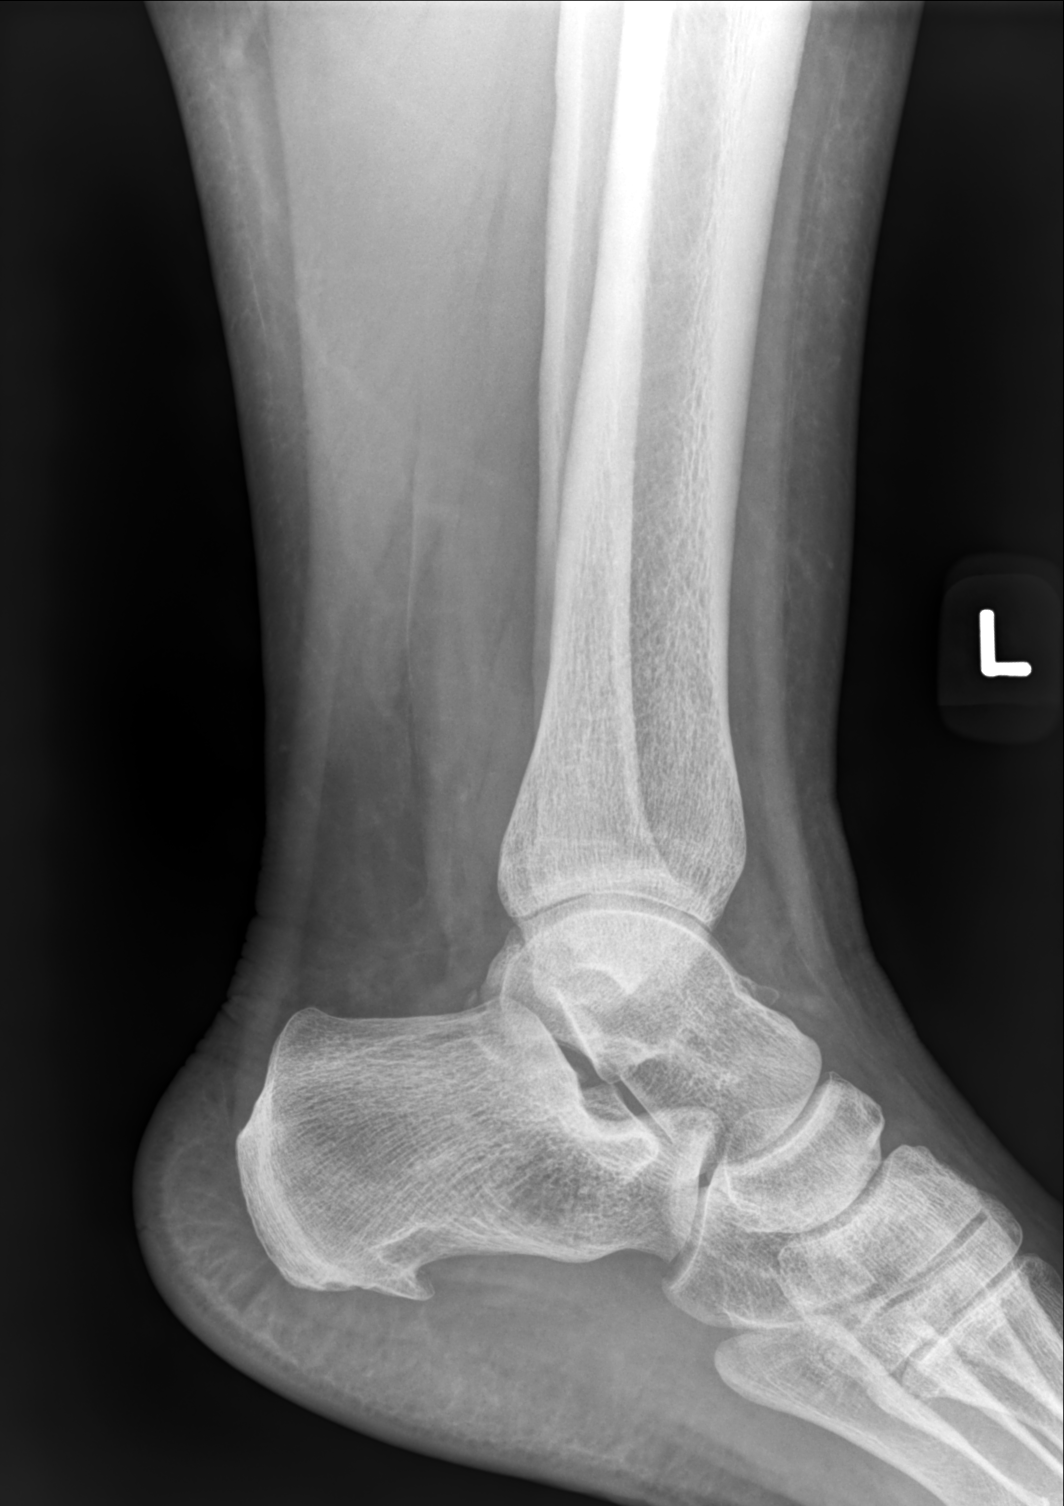

[3 of 3 positions shown; findings below may reference images not displayed]

FINDINGS: Frontal, oblique, and lateral views were obtained. There is soft
tissue swelling. There is no appreciable acute fracture or joint
effusion. There is no appreciable joint space narrowing or erosion.
There is an inferior calcaneal spur.
IMPRESSION: Soft tissue swelling. No fracture or appreciable joint space
narrowing. There is an inferior calcaneal spur.

## 2022-06-12 DIAGNOSIS — S233XXA Sprain of ligaments of thoracic spine, initial encounter: Secondary | ICD-10-CM | POA: Diagnosis not present

## 2022-06-12 DIAGNOSIS — S134XXA Sprain of ligaments of cervical spine, initial encounter: Secondary | ICD-10-CM | POA: Diagnosis not present

## 2022-06-16 DIAGNOSIS — S233XXA Sprain of ligaments of thoracic spine, initial encounter: Secondary | ICD-10-CM | POA: Diagnosis not present

## 2022-06-16 DIAGNOSIS — S134XXA Sprain of ligaments of cervical spine, initial encounter: Secondary | ICD-10-CM | POA: Diagnosis not present

## 2022-07-09 DIAGNOSIS — M25531 Pain in right wrist: Secondary | ICD-10-CM | POA: Diagnosis not present

## 2022-07-25 DIAGNOSIS — M654 Radial styloid tenosynovitis [de Quervain]: Secondary | ICD-10-CM | POA: Diagnosis not present

## 2022-07-31 DIAGNOSIS — T148XXD Other injury of unspecified body region, subsequent encounter: Secondary | ICD-10-CM | POA: Diagnosis not present

## 2022-10-31 DIAGNOSIS — R21 Rash and other nonspecific skin eruption: Secondary | ICD-10-CM | POA: Diagnosis not present

## 2022-11-10 DIAGNOSIS — R21 Rash and other nonspecific skin eruption: Secondary | ICD-10-CM | POA: Diagnosis not present

## 2022-11-19 ENCOUNTER — Ambulatory Visit: Payer: BC Managed Care – PPO | Admitting: Allergy & Immunology

## 2022-11-19 ENCOUNTER — Other Ambulatory Visit: Payer: Self-pay

## 2022-11-19 ENCOUNTER — Encounter: Payer: Self-pay | Admitting: Allergy & Immunology

## 2022-11-19 VITALS — BP 110/84 | HR 92 | Temp 99.2°F | Resp 16 | Ht 60.5 in | Wt 198.0 lb

## 2022-11-19 DIAGNOSIS — R21 Rash and other nonspecific skin eruption: Secondary | ICD-10-CM

## 2022-11-19 DIAGNOSIS — J4530 Mild persistent asthma, uncomplicated: Secondary | ICD-10-CM

## 2022-11-19 MED ORDER — DOXYCYCLINE MONOHYDRATE 100 MG PO TABS: 100.0000 mg | ORAL_TABLET | Freq: Two times a day (BID) | ORAL | 0 refills | Status: AC

## 2022-11-19 MED ORDER — AIRSUPRA 90-80 MCG/ACT IN AERO: 2.0000 | INHALATION_SPRAY | RESPIRATORY_TRACT | 5 refills | Status: AC | PRN

## 2022-11-19 NOTE — Patient Instructions (Addendum)
1. Rash - I think this might be a severe case of folliculitis, so I am going to start a couple of weeks of doxycycline.  - I am also going to get some labs to rule out weird causes of itching. - We will call you in 1-2 weeks with the results of the testing.  - Alpha-Gal Panel - ANA, IFA (with reflex) - Chronic Urticaria - C-reactive protein - CMP14+EGFR - Sedimentation rate - Tryptase - Thyroid antibodies - CBC With Differential - Start doxycycline 100 mg twice daily for two weeks (avoid the sun since this can cause a photodermatitis when you are taking doxycycline and in the sun). - Continue to moisturize with a bland emollient (see below). - Start Allegra (fexofenadine) 180mg  twice daily (non-sedating antihistamine) to help with the itch. - Continue with Pepcid (famotidine) 20mg  twice daily.  2. Mild persistent asthma, uncomplicated - Lung testing was fairly good and did improve slightly with the albuterol. - This points towards a diagnosis of asthma, but does not necessarily rule it out. - We are going to start Trelegy one puff once daily (contains three medications to help with asthma). - Monitor the coughing to see if this helps and let us know in two weeks how you are doing). - We are also giving you a sample of AirSupra (to use in place of albuterol). - AirSupra contains albuterol with a steroid to help make the albuterol more effective and overall decrease your albuterol use.  - Spacer sample and demonstration provided. - Daily controller medication(s): Trelegy 100/62.5/25 one puff once daily - Prior to physical activity: AirSupra two puffs 10-15 minutes before physical activity. - Rescue medications: AirSupra 4 puffs every 4-6 hours as needed - Asthma control goals:  * Full participation in all desired activities (may need albuterol before activity) * Albuterol use two time or less a week on average (not counting use with activity) * Cough interfering with sleep two time or  less a month * Oral steroids no more than once a year * No hospitalizations  3. Return in about 4 weeks (around 12/17/2022). You can have the follow up appointment with Dr. Dellis Anes or a Nurse Practicioner (our Nurse Practitioners are excellent and always have Physician oversight!).    Please inform us of any Emergency Department visits, hospitalizations, or changes in symptoms. Call us before going to the ED for breathing or allergy symptoms since we might be able to fit you in for a sick visit. Feel free to contact us anytime with any questions, problems, or concerns.  It was a pleasure to meet you today!  Websites that have reliable patient information: 1. American Academy of Asthma, Allergy, and Immunology: www.aaaai.org 2. Food Allergy Research and Education (FARE): foodallergy.org 3. Mothers of Asthmatics: http://www.asthmacommunitynetwork.org 4. American College of Allergy, Asthma, and Immunology: www.acaai.org   COVID-19 Vaccine Information can be found at: [email or web address].[email or web address] For questions related to vaccine distribution or appointments, please email [email or web address] or call [phone].   We realize that you might be concerned about having an allergic reaction to the COVID19 vaccines. To help with that concern, WE ARE OFFERING THE COVID19 VACCINES IN OUR OFFICE! Ask the front desk for dates!     "Like" [place] on Facebook and Instagram for our latest updates!      A healthy democracy works best when [employer] participate! Make sure you are registered to vote! If you have moved or changed any of your contact information, you will need to get this updated before  voting!  In some cases, you MAY be able to register to vote online: [email or web address]      Apply Aquaphor, Eucerin, Vanicream, Cerave, or Vaseline jelly twice a day.      Use soaps and shampoos that are unscented and  have the fewest amounts of additives. Some good examples include:

## 2022-11-19 NOTE — Progress Notes (Signed)
NEW PATIENT  Date of Service/Encounter:  11/19/22  Consult requested by: Benita Stabile, MD   Assessment:   Mild persistent asthma, uncomplicated   Rash - possible folliculitis  Plan/Recommendations:   1. Rash - I think this might be a severe case of folliculitis, so I am going to start a couple of weeks of doxycycline.  - I am also going to get some labs to rule out weird causes of itching. - We will call you in 1-2 weeks with the results of the testing.  - Alpha-Gal Panel - ANA, IFA (with reflex) - Chronic Urticaria - C-reactive protein - CMP14+EGFR - Sedimentation rate - Tryptase - Thyroid antibodies - CBC With Differential - Start doxycycline 100 mg twice daily for two weeks (avoid the sun since this can cause a photodermatitis when you are taking doxycycline and in the sun). - Continue to moisturize with a bland emollient (see below). - Start Allegra (fexofenadine) 180mg  twice daily (non-sedating antihistamine) to help with the itch. - Continue with Pepcid (famotidine) 20mg  twice daily.  2. Mild persistent asthma, uncomplicated - Lung testing was fairly good and did improve slightly with the albuterol. - This points towards a diagnosis of asthma, but does not necessarily rule it out. - We are going to start Trelegy one puff once daily (contains three medications to help with asthma). - Monitor the coughing to see if this helps and let us know in two weeks how you are doing). - We are also giving you a sample of AirSupra (to use in place of albuterol). - AirSupra contains albuterol with a steroid to help make the albuterol more effective and overall decrease your albuterol use.  - Spacer sample and demonstration provided. - Daily controller medication(s): Trelegy 100/62.5/25 one puff once daily - Prior to physical activity: AirSupra two puffs 10-15 minutes before physical activity. - Rescue medications: AirSupra 4 puffs every 4-6 hours as needed - Asthma control  goals:  * Full participation in all desired activities (may need albuterol before activity) * Albuterol use two time or less a week on average (not counting use with activity) * Cough interfering with sleep two time or less a month * Oral steroids no more than once a year * No hospitalizations  3. Return in about 4 weeks (around 12/17/2022). You can have the follow up appointment with Dr. Dellis Anes or a Nurse Practicioner (our Nurse Practitioners are excellent and always have Physician oversight!).    This note in its entirety was forwarded to the Provider who requested this consultation.  Subjective:   Morgan Franklin is a 27 y.o. female presenting today for evaluation of  Chief Complaint  Patient presents with   Urticaria    Broken out all over the upper body with hives. Hives are itchy.     Morgan Franklin has a history of the following: Patient Active Problem List   Diagnosis Date Noted   White coat syndrome with high blood pressure but without hypertension 02/01/2021   Elevated BP without diagnosis of hypertension 02/01/2021   Intractable menstrual migraine without status migrainosus 08/17/2020   Dysmenorrhea 08/17/2020   Menorrhagia with irregular cycle 08/17/2020   History of abnormal cervical Pap smear 08/17/2020   LGSIL on Pap smear of cervix 11/02/2019   Irregular intermenstrual bleeding 09/26/2019   Menstrual cramps 09/26/2019   Abdominal cramping 07/10/2016   Encounter for surveillance of contraceptive pills 07/10/2016   Irregular periods 10/14/2012   Androgen excess 10/14/2012    History obtained from:  chart review and patient.  Morgan Franklin was referred by Benita Stabile, MD.     Morgan Franklin is a 27 y.o. female presenting for an evaluation of urticaria/rash .  She reports that 3 weeks ago, she noticed that her chest was broken out. Then it started spreading. It is all over her upper body. She went to see her PCP (NP Alphonzo Grieve). She was diagnosed with an allergic  reaction. She was placed on prednisone after a steroid injection. Then she was placed on a prednisone taper and she got another steroid injection. Nothing seemed to help. She was placed on Pepcid BID and she was using Benadryl throughout the day. She was not given an non-sedating antihistamine.   She denies throat swelling, coughing or stomach pain. This was only the pruritus. She denies new exposures at all over the last month. It does not seem to be related to foods at all. She is unsure of the triggers. Tere are no new cosmetics at all. It has never happened in the past. She has not changed her foods at all with this. She has not added anything new at all.  She has not noticed any insects in her home including bed bugs. There is a dog in the home that has been there for 7 years. She denies fevers and joint pains. There is no history of autoimmune disease.   Of note, she had surgery when she was 80 days old. 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 She has no GI issues from this. She did have her appendix removed when they were in there.   Of note, she did go swimming a week or two before the rash started. No one else who went swimming had this.   She has reacted to red dye in the past (has emesis from exposure). She also has some throat swelling with cantaloupe. She tolerates watermelon without a problem.    Asthma/Respiratory Symptom History: She does report that she has problems breathing when she is sick. She has used albuterol in the past.  She does report a nighttime cough 7 nights per week. She does tend to get bronchitis in the winter months, maybe 1-2 time per winter. She gets treated with cough medicine and antibiotics.   Allergic Rhinitis Symptom History: She has some seasonal allergies during the spring season.   Otherwise, there is no history of other atopic diseases, including asthma, drug allergies, stinging insect allergies, or contact  dermatitis. There is no significant infectious history. Vaccinations are up to date.    Past Medical History: Patient Active Problem List   Diagnosis Date Noted   White coat syndrome with high blood pressure but without hypertension 02/01/2021   Elevated BP without diagnosis of hypertension 02/01/2021   Intractable menstrual migraine without status migrainosus 08/17/2020   Dysmenorrhea 08/17/2020   Menorrhagia with irregular cycle 08/17/2020   History of abnormal cervical Pap smear 08/17/2020   LGSIL on Pap smear of cervix 11/02/2019   Irregular intermenstrual bleeding 09/26/2019   Menstrual cramps 09/26/2019   Abdominal cramping 07/10/2016   Encounter for surveillance of contraceptive pills 07/10/2016   Irregular periods 10/14/2012   Androgen excess 10/14/2012    Medication List:  Allergies as of 11/19/2022   No Known Allergies      Medication List        Accurate as of November 19, 2022 12:29 PM. If you have any questions, ask your nurse or doctor.  Airsupra 90-80 MCG/ACT Aero Generic drug: Albuterol-Budesonide Inhale 2 puffs into the lungs every 4 (four) hours as needed. Started by: Alfonse Spruce   albuterol 108 (90 Base) MCG/ACT inhaler Commonly known as: VENTOLIN HFA Inhale 1-2 puffs into the lungs every 6 (six) hours as needed for wheezing or shortness of breath.   clindamycin 1 % gel Commonly known as: CLINDAGEL Apply topically 2 (two) times daily.   clobetasol ointment 0.05 % Commonly known as: TEMOVATE Apply topically 2 (two) times daily.   doxycycline 100 MG capsule Commonly known as: VIBRAMYCIN Take 1 capsule (100 mg total) by mouth 2 (two) times daily.   doxycycline 100 MG tablet Commonly known as: ADOXA Take 1 tablet (100 mg total) by mouth 2 (two) times daily for 14 days. Started by: Alfonse Spruce   SIMPESSE PO Take 1 tablet by mouth daily.   Levonorgestrel-Ethinyl Estradiol 0.15-0.03 &0.01 MG tablet Commonly known  as: AMETHIA TAKE 1 TABLET BY MOUTH EVERY DAY   promethazine-dextromethorphan 6.25-15 MG/5ML syrup Commonly known as: PROMETHAZINE-DM Take 5 mLs by mouth 4 (four) times daily as needed.        Birth History: non-contributory  Developmental History: non-contributory  Past Surgical History: Past Surgical History:  Procedure Laterality Date   ABDOMINAL EXPLORATION SURGERY Bilateral 11/25/2019   GASTROSCHISIS CLOSURE     88 days old     Family History: Family History  Problem Relation Age of Onset   Hypertension Mother    Stroke Mother    Diabetes Father    Diabetes Maternal Grandfather    Diabetes Paternal Grandmother    Eczema Nephew    Eczema Niece    Asthma Niece      Social History: Chelsia lives at home with her family.  She was in a house that is 28 years old.  There is laminate in the main living areas and carpeting in the bedroom.  They have electric heating and central cooling.  There are dogs inside of the home.  There are no dust mite covers on the bedding.  There is no tobacco exposure.  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  There is no fume, chemical, or dust exposure.  She does not live near an interstate or industrial area. She works for [employer].    Review of systems otherwise negative other than that mentioned in the HPI.    Objective:   Blood pressure 110/84, pulse 92, temperature 99.2 F (37.3 C), resp. rate 16, height 5' 0.5" (1.537 m), weight 198 lb (89.8 kg), SpO2 98%. Body mass index is 38.03 kg/m.     Physical Exam Vitals reviewed.  Constitutional:      Appearance: She is well-developed.  HENT:     Head: Normocephalic and atraumatic.     Right Ear: Tympanic membrane, ear canal and external ear normal. No drainage, swelling or tenderness. Tympanic membrane is not injected, scarred, erythematous, retracted or bulging.     Left Ear: Tympanic membrane, ear canal and external ear normal. No drainage,  swelling or tenderness. Tympanic membrane is not injected, scarred, erythematous, retracted or bulging.     Nose: No nasal deformity, septal deviation, mucosal edema or rhinorrhea.     Right Sinus: No maxillary sinus tenderness or frontal sinus tenderness.     Left Sinus: No maxillary sinus tenderness or frontal sinus tenderness.     Mouth/Throat:     Mouth: Mucous membranes are not pale and not dry.  Pharynx: Uvula midline.  Eyes:     General:        Right eye: No discharge.        Left eye: No discharge.     Conjunctiva/sclera: Conjunctivae normal.     Right eye: Right conjunctiva is not injected. No chemosis.    Left eye: Left conjunctiva is not injected. No chemosis.    Pupils: Pupils are equal, round, and reactive to light.  Cardiovascular:     Rate and Rhythm: Normal rate and regular rhythm.     Heart sounds: Normal heart sounds.  Pulmonary:     Effort: Pulmonary effort is normal. No tachypnea, accessory muscle usage or respiratory distress.     Breath sounds: Normal breath sounds. No wheezing, rhonchi or rales.  Chest:     Chest wall: No tenderness.  Abdominal:     Tenderness: There is no abdominal tenderness. There is no guarding or rebound.  Lymphadenopathy:     Head:     Right side of head: No submandibular, tonsillar or occipital adenopathy.     Left side of head: No submandibular, tonsillar or occipital adenopathy.     Cervical: No cervical adenopathy.  Skin:    General: Skin is warm.     Capillary Refill: Capillary refill takes less than 2 seconds.     Coloration: Skin is not pale.     Findings: Rash present. No abrasion, erythema or petechiae. Rash is not papular, urticarial or vesicular.     Comments: Dry papular lesions over the entire body noted most prominently on the arms, neck, and back. She does show me some of the lesions that appear to be almost oozing, although none are doing so today.   Neurological:     Mental Status: She is alert.      Diagnostic  studies:    Spirometry: results normal (FEV1: 2.84/101%, FVC: 3.16/96%, FEV1/FVC: 90%).    Spirometry consistent with normal pattern. Albuterol four puffs via MDI treatment given in clinic with improvement in FEV1 and FVC, but not significant per ATS criteria.  Allergy Studies: labs sent instead           Malachi Bonds, MD [facility]

## 2022-11-19 NOTE — Addendum Note (Signed)
Addended by: Elsworth Soho on: 11/19/2022 05:39 PM   Modules accepted: Orders

## 2022-11-23 LAB — ALPHA-GAL PANEL
Allergen Lamb IgE: <0.1 kU/L
Beef IgE: <0.1 kU/L
IgE (Immunoglobulin E), Serum: 62 [IU]/mL (ref 6–495)
O215-IgE Alpha-Gal: <0.1 kU/L
Pork IgE: <0.1 kU/L

## 2022-11-26 ENCOUNTER — Encounter: Payer: Self-pay | Admitting: Allergy & Immunology

## 2022-11-26 LAB — CMP14+EGFR
ALT: 28 IU/L (ref 0–32)
AST: 18 IU/L (ref 0–40)
Albumin: 4.6 g/dL (ref 4.0–5.0)
Alkaline Phosphatase: 64 IU/L (ref 44–121)
BUN/Creatinine Ratio: 14 (ref 9–23)
BUN: 10 mg/dL (ref 6–20)
Bilirubin Total: 0.3 mg/dL (ref 0.0–1.2)
CO2: 20 mmol/L (ref 20–29)
Calcium: 9.5 mg/dL (ref 8.7–10.2)
Chloride: 103 mmol/L (ref 96–106)
Creatinine, Ser: 0.73 mg/dL (ref 0.57–1.00)
Globulin, Total: 3 g/dL (ref 1.5–4.5)
Glucose: 122 mg/dL — ABNORMAL HIGH (ref 70–99)
Potassium: 4.4 mmol/L (ref 3.5–5.2)
Sodium: 140 mmol/L (ref 134–144)
Total Protein: 7.6 g/dL (ref 6.0–8.5)
eGFR: 115 mL/min/{1.73_m2} (ref >59)

## 2022-11-26 LAB — CBC WITH DIFFERENTIAL/PLATELET
Basophils Absolute: 0 10*3/uL (ref 0.0–0.2)
Basos: 0 %
EOS (ABSOLUTE): 0.1 10*3/uL (ref 0.0–0.4)
Eos: 1 %
Hematocrit: 46.6 % (ref 34.0–46.6)
Hemoglobin: 14.9 g/dL (ref 11.1–15.9)
Immature Grans (Abs): 0 10*3/uL (ref 0.0–0.1)
Immature Granulocytes: 0 %
Lymphocytes Absolute: 2 10*3/uL (ref 0.7–3.1)
Lymphs: 17 %
MCH: 28.8 pg (ref 26.6–33.0)
MCHC: 32 g/dL (ref 31.5–35.7)
MCV: 90 fL (ref 79–97)
Monocytes Absolute: 0.8 10*3/uL (ref 0.1–0.9)
Monocytes: 7 %
Neutrophils Absolute: 8.8 10*3/uL — ABNORMAL HIGH (ref 1.4–7.0)
Neutrophils: 75 %
Platelets: 379 10*3/uL (ref 150–450)
RBC: 5.18 x10E6/uL (ref 3.77–5.28)
RDW: 12.4 % (ref 11.7–15.4)
WBC: 11.7 10*3/uL — ABNORMAL HIGH (ref 3.4–10.8)

## 2022-11-26 LAB — SEDIMENTATION RATE: Sed Rate: 14 mm/h (ref 0–32)

## 2022-11-26 LAB — CHRONIC URTICARIA: cu index: 7.7 (ref <10)

## 2022-11-26 LAB — TRYPTASE: Tryptase: 3.9 ug/L (ref 2.2–13.2)

## 2022-11-26 LAB — ANTINUCLEAR ANTIBODIES, IFA: ANA Titer 1: NEGATIVE

## 2022-11-26 LAB — THYROID ANTIBODIES (THYROPEROXIDASE & THYROGLOBULIN)
Thyroglobulin Antibody: <1 [IU]/mL (ref 0.0–0.9)
Thyroperoxidase Ab SerPl-aCnc: 13 [IU]/mL (ref 0–34)

## 2022-11-26 LAB — C-REACTIVE PROTEIN: CRP: 2 mg/L (ref 0–10)

## 2022-12-03 NOTE — Progress Notes (Signed)
She will need to be hive free for about 3-4 weeks and no antihistamines for 3 days before any skin testing. Please have her come into the clinic earlier if her hives are not well controlled and we can talk about other treatment options. Thank you

## 2022-12-05 ENCOUNTER — Encounter: Payer: Self-pay | Admitting: Family Medicine

## 2022-12-05 ENCOUNTER — Ambulatory Visit: Payer: BC Managed Care – PPO | Admitting: Family Medicine

## 2022-12-05 VITALS — BP 120/88 | HR 77 | Temp 98.0°F | Resp 16

## 2022-12-05 DIAGNOSIS — R21 Rash and other nonspecific skin eruption: Secondary | ICD-10-CM | POA: Insufficient documentation

## 2022-12-05 DIAGNOSIS — J4530 Mild persistent asthma, uncomplicated: Secondary | ICD-10-CM | POA: Diagnosis not present

## 2022-12-05 DIAGNOSIS — J3089 Other allergic rhinitis: Secondary | ICD-10-CM

## 2022-12-05 DIAGNOSIS — J309 Allergic rhinitis, unspecified: Secondary | ICD-10-CM | POA: Insufficient documentation

## 2022-12-05 MED ORDER — TRELEGY ELLIPTA 100-62.5-25 MCG/ACT IN AEPB: 1.0000 | INHALATION_SPRAY | Freq: Every day | RESPIRATORY_TRACT | 2 refills | Status: AC

## 2022-12-05 MED ORDER — TRIAMCINOLONE ACETONIDE 0.1 % EX OINT: TOPICAL_OINTMENT | CUTANEOUS | 2 refills | Status: AC

## 2022-12-05 NOTE — Patient Instructions (Signed)
Asthma Continue Trelegy 100-1 puff once a day to prevent cough or wheeze Continue Airsupra 2 puffs once every 4 hours as needed for cough or wheeze You may use Airsupra 2 puffs 5 to 15 minutes before activity to decrease cough or wheeze  Chronic rhinitis We have ordered some lab work to help [place] evaluate your symptoms Continue antihistamines as you have been Consider saline nasal rinses as needed for nasal symptoms. Use this before any medicated nasal sprays for best result  Rash Continue Allegra 180 mg twice a day and famotidine twice a day Begin triamcinolone 0.1% ointment to red and itchy areas below your face up to twice a day as needed. Do not use longer than 2 weeks in a row If your symptoms re-occur, begin a journal of events that occurred for up to 6 hours before your symptoms began including foods and beverages consumed, soaps or perfumes you had contact with, and medications.  We have ordered some lab work to help [place] evaluate your rash. We will call you when the results become available Consider referral to dermatology if no improvement in your symptoms  Call the clinic if this treatment plan is not working well for you.  Follow up in 2 months or sooner if needed.

## 2022-12-05 NOTE — Progress Notes (Signed)
[address] [place]  [place] [address] Dept: [phone]  FOLLOW UP NOTE  Patient ID: Morgan Franklin, female    DOB: [birth year]  Age: 27 y.o. MRN: [id number] Date of Office Visit: 12/05/2022  Assessment  Chief Complaint: Follow-up  HPI Morgan Franklin is a 27 year old female who presents to clinic for follow-up visit.  She was last seen in this clinic on 11/19/2022 by Dr. Dellis Anes for evaluation of asthma and rash.  She had a largely negative lab workup for urticaria and was prescribed doxycycline for 10 days for possible folliculitis.  At today's visit, she reports that she continues to experience a red, raised, itchy rash occurring on her upper chest, back, and shoulders. She completed a 2 week course of Doxycycline with out improvement of her symptoms. She continues Allegra 180 mg twice a day and famotidine 20 mg twice a day without relief of symptoms.  She continues to avoid red dye and is interested in further testing to environmental allergens and possible food allergens. Of note, she does report frequent opening of a scar across her abdomen for which she sees a dermatology specialist.   Asthma is reported as well-controlled with no shortness of breath, cough, or wheeze with activity or rest.  She continues Trelegy 100-1 puff once a day and infrequently uses Airsupra with relief of symptoms.  Chronic rhinitis is reported as moderately well-controlled with occasional nasal symptoms for which she continues antihistamines as needed.  She occasionally uses nasal saline rinses and is not currently using a nasal steroid spray.  She reports the symptoms are year-round with no seasonal component.  Her current medications are listed in the chart.   Drug Allergies:  No Known Allergies  Physical Exam: BP 120/88   Pulse 77   Temp 98 F (36.7 C)   Resp 16   SpO2 98%    Physical Exam Vitals reviewed.  Constitutional:      Appearance: Normal appearance.  HENT:     Head:  Normocephalic and atraumatic.     Right Ear: Tympanic membrane normal.     Left Ear: Tympanic membrane normal.     Nose:     Comments: Bilateral nares slightly erythematous with thin clear nasal drainage noted.  Pharynx normal.  Ears normal.  Eyes normal.    Mouth/Throat:     Pharynx: Oropharynx is clear.  Eyes:     Conjunctiva/sclera: Conjunctivae normal.  Cardiovascular:     Rate and Rhythm: Normal rate and regular rhythm.     Heart sounds: Normal heart sounds. No murmur heard. Pulmonary:     Effort: Pulmonary effort is normal.     Breath sounds: Normal breath sounds.     Comments: Lungs clear to auscultation Musculoskeletal:     Cervical back: Normal range of motion and neck supple.  Skin:    Comments: Raised pinpoint bumps on her upper chest, upper back, and shoulders.  No open areas or drainage noted.  Neurological:     Mental Status: She is alert and oriented to person, place, and time.  Psychiatric:        Mood and Affect: Mood normal.        Behavior: Behavior normal.        Thought Content: Thought content normal.        Judgment: Judgment normal.     Diagnostics: FVC 2.23 which is 69% of predicted value, FEV1 1.95 which is 73% of predicted value.  Spirometry indicates possible restriction.  Assessment and Plan: 1.  Rash   2. Mild persistent asthma, uncomplicated   3. Non-seasonal allergic rhinitis due to other allergic trigger     Meds ordered this encounter  Medications   TRELEGY ELLIPTA 100-62.5-25 MCG/ACT AEPB    Sig: Inhale 1 puff into the lungs daily.    Dispense:  28 each    Refill:  2   triamcinolone ointment (KENALOG) 0.1 %    Sig: 1 application 2 times daily as needed below the face, avoid armpits and groin. Do not use for longer then 2 weeks in a row.    Dispense:  30 g    Refill:  2    Patient Instructions  Asthma Continue Trelegy 100-1 puff once a day to prevent cough or wheeze Continue Airsupra 2 puffs once every 4 hours as needed for cough  or wheeze You may use Airsupra 2 puffs 5 to 15 minutes before activity to decrease cough or wheeze  Chronic rhinitis We have ordered some lab work to help [place] evaluate your symptoms Continue antihistamines as you have been Consider saline nasal rinses as needed for nasal symptoms. Use this before any medicated nasal sprays for best result  Rash Continue Allegra 180 mg twice a day and famotidine twice a day Begin triamcinolone 0.1% ointment to red and itchy areas below your face up to twice a day as needed. Do not use longer than 2 weeks in a row If your symptoms re-occur, begin a journal of events that occurred for up to 6 hours before your symptoms began including foods and beverages consumed, soaps or perfumes you had contact with, and medications.  We have ordered some lab work to help [place] evaluate your rash. We will call you when the results become available Consider referral to dermatology if no improvement in your symptoms  Call the clinic if this treatment plan is not working well for you.  Follow up in 2 months or sooner if needed.  Return in about 2 months (around 02/04/2023), or if symptoms worsen or fail to improve.    Thank you for the opportunity to care for this patient.  Please do not hesitate to contact me with questions.  Thermon Leyland, FNP [facility]

## 2022-12-11 DIAGNOSIS — R21 Rash and other nonspecific skin eruption: Secondary | ICD-10-CM | POA: Diagnosis not present

## 2022-12-11 DIAGNOSIS — E782 Mixed hyperlipidemia: Secondary | ICD-10-CM | POA: Diagnosis not present

## 2022-12-11 DIAGNOSIS — I10 Essential (primary) hypertension: Secondary | ICD-10-CM | POA: Diagnosis not present

## 2022-12-11 DIAGNOSIS — E1165 Type 2 diabetes mellitus with hyperglycemia: Secondary | ICD-10-CM | POA: Diagnosis not present

## 2022-12-12 ENCOUNTER — Ambulatory Visit: Payer: BC Managed Care – PPO | Admitting: Allergy & Immunology

## 2022-12-14 LAB — FOOD ALLERGY PROFILE
Allergen Corn, IgE: <0.1 kU/L
Clam IgE: <0.1 kU/L
Codfish IgE: <0.1 kU/L
Egg White IgE: <0.1 kU/L
Milk IgE: <0.1 kU/L
Peanut IgE: <0.1 kU/L
Scallop IgE: <0.1 kU/L
Sesame Seed IgE: <0.1 kU/L
Shrimp IgE: 0.27 kU/L — AB
Soybean IgE: <0.1 kU/L
Walnut IgE: <0.1 kU/L
Wheat IgE: <0.1 kU/L

## 2022-12-14 LAB — ALLERGENS, ZONE 2
Alternaria Alternata IgE: 0.69 kU/L — AB
Amer Sycamore IgE Qn: <0.1 kU/L
Aspergillus Fumigatus IgE: 0.2 kU/L — AB
Bahia Grass IgE: <0.1 kU/L
Bermuda Grass IgE: <0.1 kU/L
Cat Dander IgE: 0.26 kU/L — AB
Cedar, Mountain IgE: <0.1 kU/L
Cladosporium Herbarum IgE: 0.26 kU/L — AB
Cockroach, American IgE: 0.11 kU/L — AB
Common Silver Birch IgE: <0.1 kU/L
D Farinae IgE: 0.23 kU/L — AB
D Pteronyssinus IgE: 0.27 kU/L — AB
Dog Dander IgE: <0.1 kU/L
Elm, American IgE: <0.1 kU/L
Hickory, White IgE: <0.1 kU/L
Johnson Grass IgE: <0.1 kU/L
Maple/Box Elder IgE: <0.1 kU/L
Mucor Racemosus IgE: <0.1 kU/L
Mugwort IgE Qn: <0.1 kU/L
Nettle IgE: <0.1 kU/L
Oak, White IgE: <0.1 kU/L
Penicillium Chrysogen IgE: 0.11 kU/L — AB
Pigweed, Rough IgE: <0.1 kU/L
Plantain, English IgE: <0.1 kU/L
Ragweed, Short IgE: <0.1 kU/L
Sheep Sorrel IgE Qn: <0.1 kU/L
Stemphylium Herbarum IgE: 0.98 kU/L — AB
Sweet gum IgE RAST Ql: <0.1 kU/L
Timothy Grass IgE: <0.1 kU/L
White Mulberry IgE: <0.1 kU/L

## 2022-12-15 DIAGNOSIS — Q433 Congenital malformations of intestinal fixation: Secondary | ICD-10-CM | POA: Diagnosis not present

## 2022-12-15 DIAGNOSIS — T8130XA Disruption of wound, unspecified, initial encounter: Secondary | ICD-10-CM | POA: Diagnosis not present

## 2022-12-15 DIAGNOSIS — E782 Mixed hyperlipidemia: Secondary | ICD-10-CM | POA: Diagnosis not present

## 2022-12-15 DIAGNOSIS — M359 Systemic involvement of connective tissue, unspecified: Secondary | ICD-10-CM | POA: Diagnosis not present

## 2022-12-15 DIAGNOSIS — Z0001 Encounter for general adult medical examination with abnormal findings: Secondary | ICD-10-CM | POA: Diagnosis not present

## 2022-12-15 DIAGNOSIS — E1165 Type 2 diabetes mellitus with hyperglycemia: Secondary | ICD-10-CM | POA: Diagnosis not present

## 2022-12-15 NOTE — Progress Notes (Signed)
Thank you :)

## 2022-12-15 NOTE — Progress Notes (Signed)
Can you please let this patient know that lab results have returned and indicate environmental allergies to dust mites, cat, cockroach, and molds. Please send out allergen avoidance measures. Food testing was negative with the exception of borderline positive to shrimp. Any trouble with shrimp previously? She can avoid shrimp for now and see if her skin clears with avoidance. Thank you

## 2022-12-17 ENCOUNTER — Ambulatory Visit: Payer: BC Managed Care – PPO | Admitting: Family Medicine

## 2022-12-19 DIAGNOSIS — L304 Erythema intertrigo: Secondary | ICD-10-CM | POA: Diagnosis not present

## 2022-12-19 DIAGNOSIS — Z7984 Long term (current) use of oral hypoglycemic drugs: Secondary | ICD-10-CM | POA: Diagnosis not present

## 2022-12-19 DIAGNOSIS — E663 Overweight: Secondary | ICD-10-CM | POA: Diagnosis not present

## 2022-12-19 DIAGNOSIS — L98492 Non-pressure chronic ulcer of skin of other sites with fat layer exposed: Secondary | ICD-10-CM | POA: Diagnosis not present

## 2022-12-19 DIAGNOSIS — E11622 Type 2 diabetes mellitus with other skin ulcer: Secondary | ICD-10-CM | POA: Diagnosis not present

## 2022-12-30 DIAGNOSIS — S31109A Unspecified open wound of abdominal wall, unspecified quadrant without penetration into peritoneal cavity, initial encounter: Secondary | ICD-10-CM | POA: Diagnosis not present

## 2023-01-05 DIAGNOSIS — Z7984 Long term (current) use of oral hypoglycemic drugs: Secondary | ICD-10-CM | POA: Diagnosis not present

## 2023-01-05 DIAGNOSIS — E11622 Type 2 diabetes mellitus with other skin ulcer: Secondary | ICD-10-CM | POA: Diagnosis not present

## 2023-01-05 DIAGNOSIS — L304 Erythema intertrigo: Secondary | ICD-10-CM | POA: Diagnosis not present

## 2023-01-05 DIAGNOSIS — L98492 Non-pressure chronic ulcer of skin of other sites with fat layer exposed: Secondary | ICD-10-CM | POA: Diagnosis not present

## 2023-01-15 DIAGNOSIS — E1165 Type 2 diabetes mellitus with hyperglycemia: Secondary | ICD-10-CM | POA: Diagnosis not present

## 2023-01-15 DIAGNOSIS — R110 Nausea: Secondary | ICD-10-CM | POA: Diagnosis not present

## 2023-01-26 DIAGNOSIS — S93402A Sprain of unspecified ligament of left ankle, initial encounter: Secondary | ICD-10-CM | POA: Diagnosis not present

## 2023-02-01 ENCOUNTER — Other Ambulatory Visit: Payer: Self-pay | Admitting: Adult Health

## 2023-03-03 NOTE — Progress Notes (Deleted)
   [address] [place] [address] Dept: [phone]  FOLLOW UP NOTE  Patient ID: Morgan Franklin, female    DOB: 04-20-1995  Age: 27 y.o. MRN: [id number] Date of Office Visit: 03/04/2023  Assessment  Chief Complaint: No chief complaint on file.  HPI Morgan Franklin is a 27 year old female who presents to the clinic for follow-up visit.  She was last seen in this clinic on 12/05/2022 by Thermon Leyland, FNP, for evaluation of asthma, allergic rhinitis, rash, and possible food allergy to shellfish.  Discussed the use of AI scribe software for clinical note transcription with the patient, who gave verbal consent to proceed.  History of Present Illness             Drug Allergies:  No Known Allergies  Physical Exam: There were no vitals taken for this visit.   Physical Exam  Diagnostics:    Assessment and Plan: No diagnosis found.  No orders of the defined types were placed in this encounter.   There are no Patient Instructions on file for this visit.  No follow-ups on file.    Thank you for the opportunity to care for this patient.  Please do not hesitate to contact me with questions.  Thermon Leyland, FNP [facility]

## 2023-03-04 ENCOUNTER — Ambulatory Visit: Payer: BC Managed Care – PPO | Admitting: Family Medicine

## 2023-07-10 IMAGING — DX DG SHOULDER 2+V*R*
2 series · 2 of 2 positions shown · non-contrast
Comparison: None.

CLINICAL DATA: Arm injury

EXAM:
RIGHT SHOULDER - 2+ VIEW

[shoulder grashey]
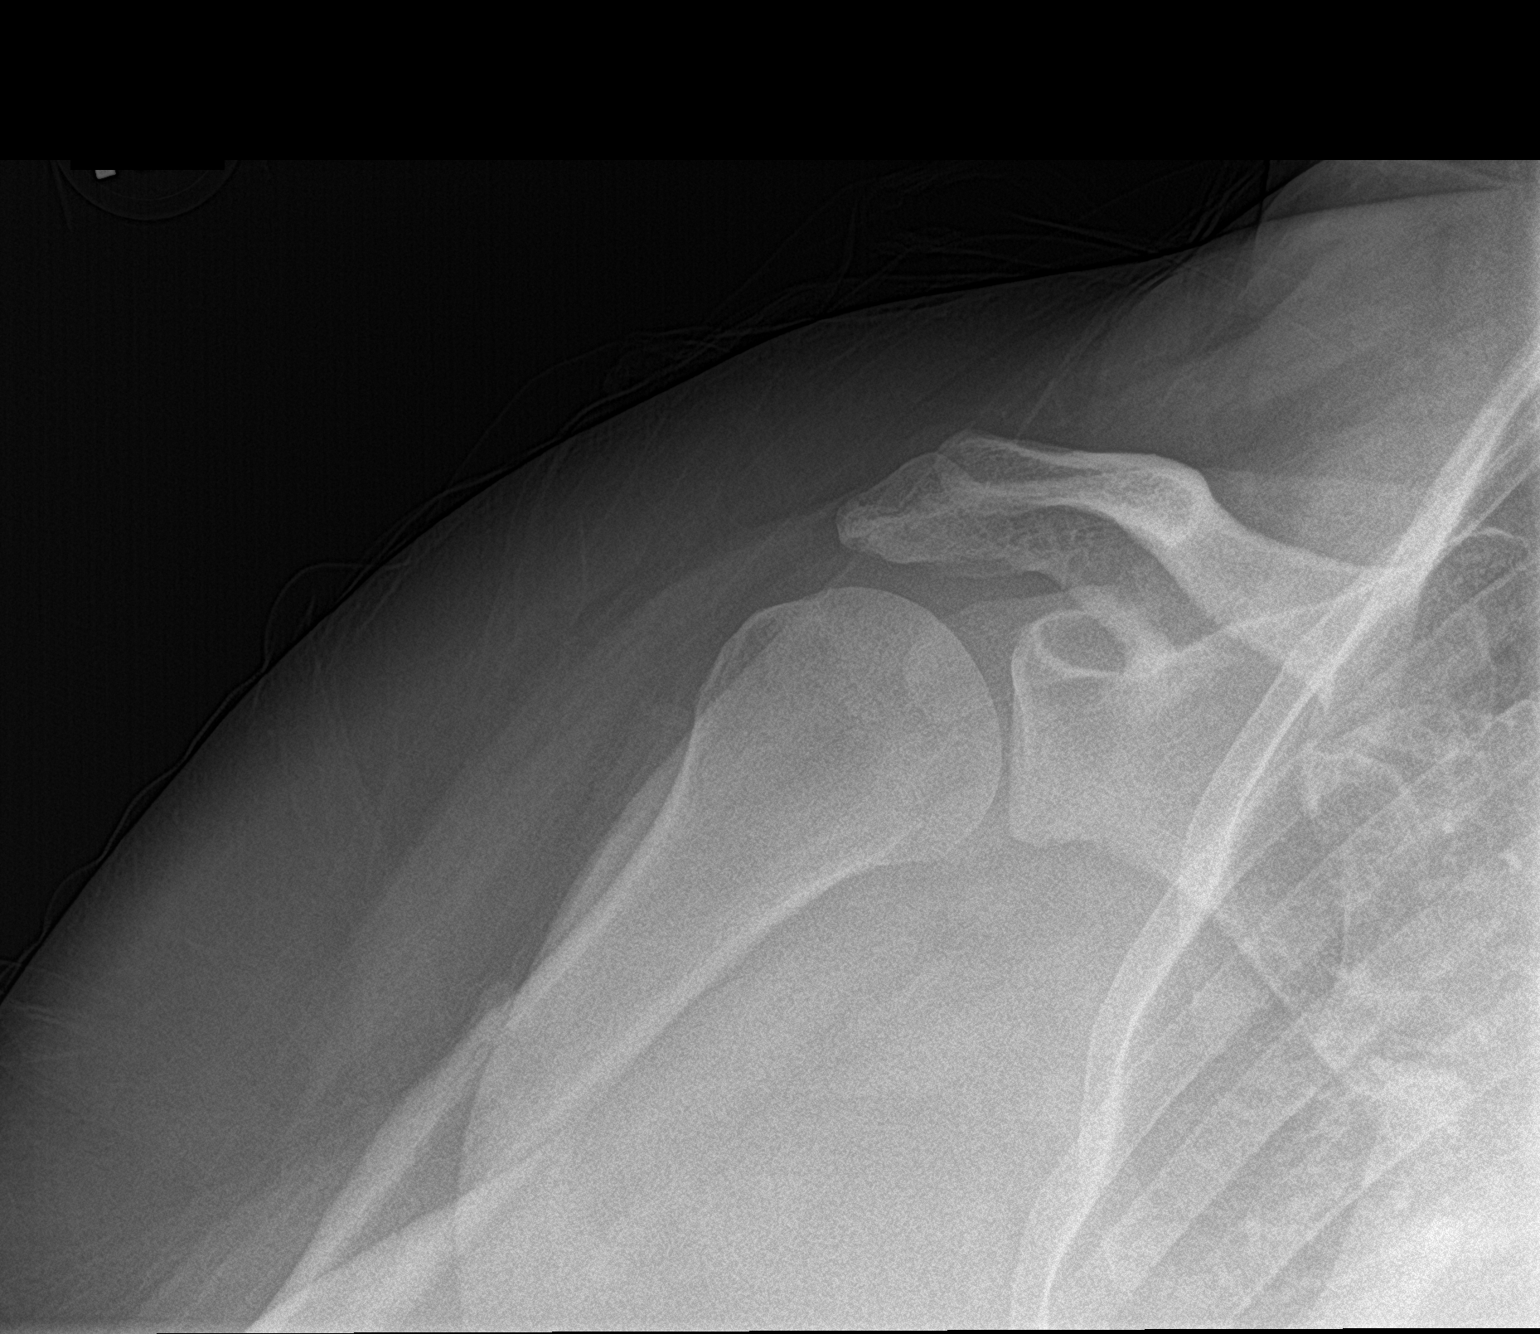

[shoulder y view]
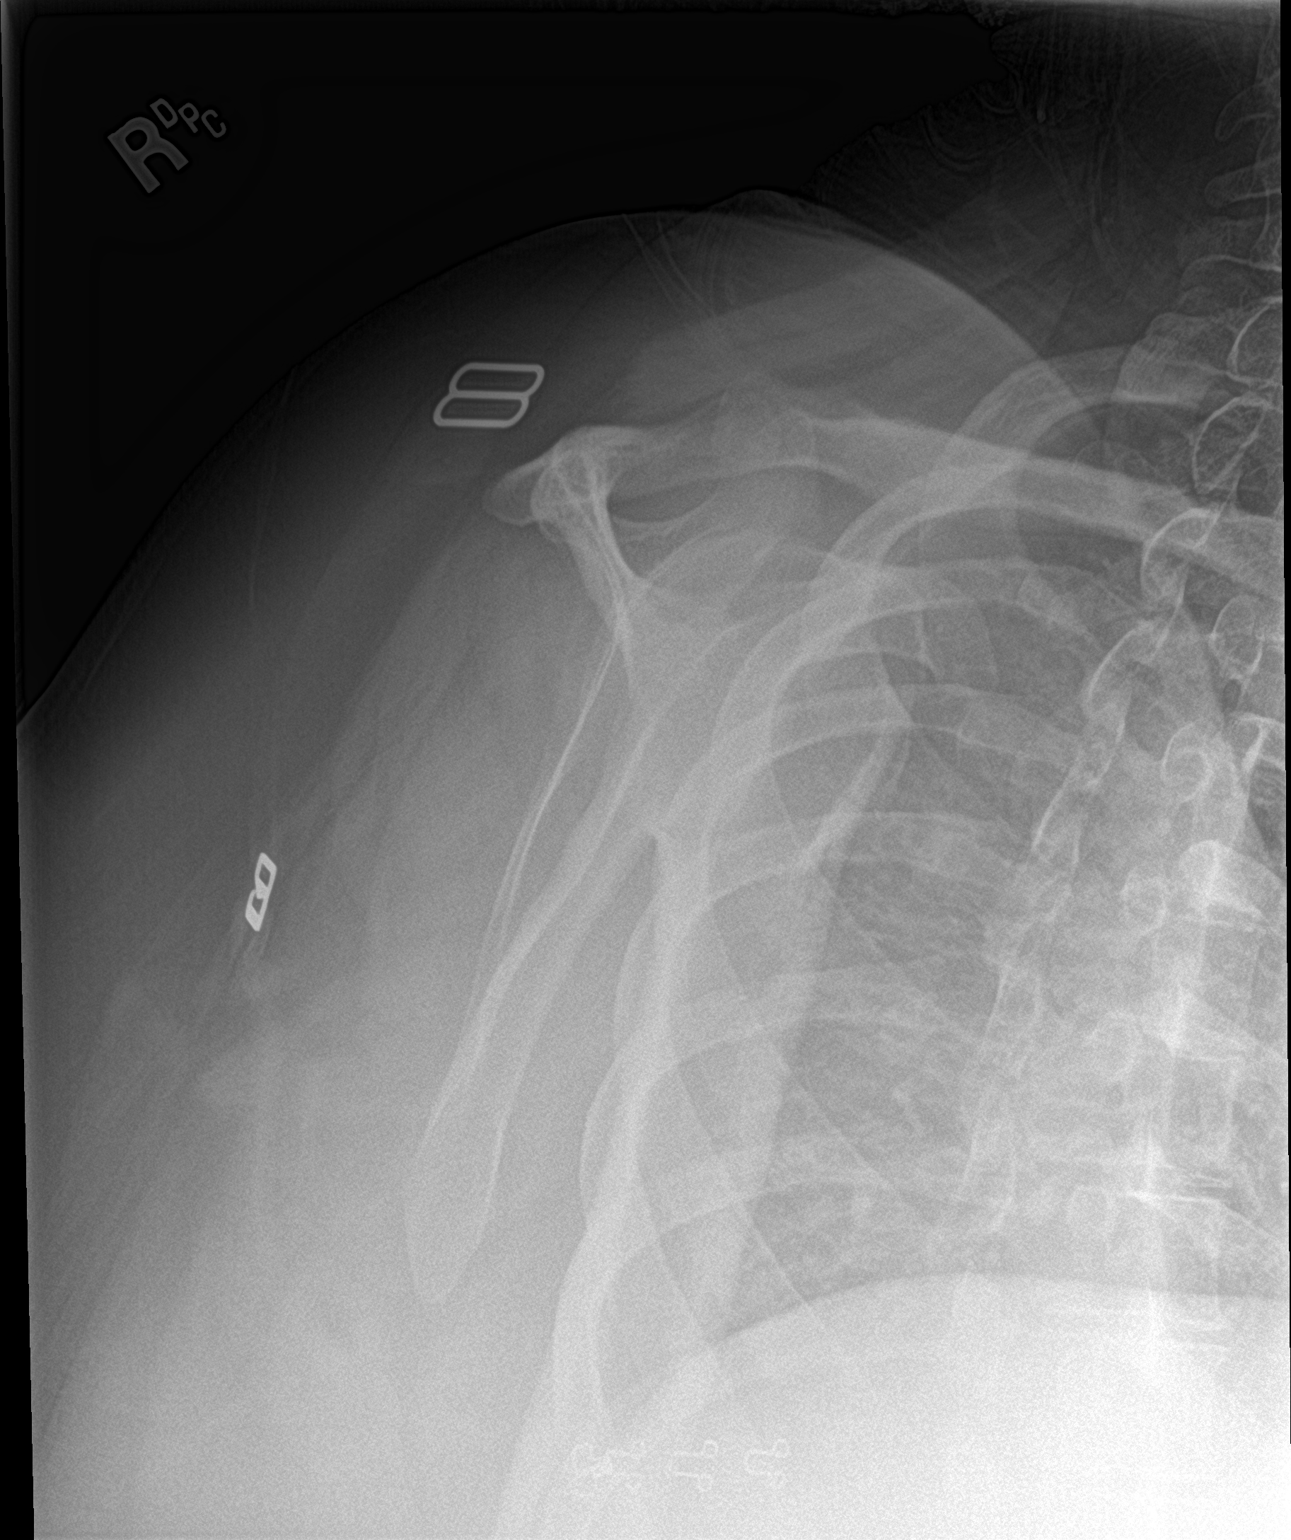

[2 of 2 positions shown; findings below may reference images not displayed]

FINDINGS: There is no evidence of fracture or dislocation. There is no
evidence of arthropathy or other focal bone abnormality. Soft
tissues are unremarkable.
IMPRESSION: Negative.

## 2023-11-27 ENCOUNTER — Encounter: Payer: Self-pay | Admitting: Radiology

## 2024-02-08 ENCOUNTER — Encounter: Payer: Self-pay | Admitting: Radiology
